# Patient Record
Sex: Male | Born: 1985 | Race: Black or African American | Hispanic: No | Smoking: Never smoker
Health system: Southern US, Community
[De-identification: ages and names within clinical notes are randomized; demographics above are authoritative.]

## PROBLEM LIST (undated history)

## (undated) DIAGNOSIS — N200 Calculus of kidney: Secondary | ICD-10-CM

## (undated) DIAGNOSIS — D168 Benign neoplasm of pelvic bones, sacrum and coccyx: Secondary | ICD-10-CM

## (undated) DIAGNOSIS — K311 Adult hypertrophic pyloric stenosis: Secondary | ICD-10-CM

## (undated) DIAGNOSIS — B029 Zoster without complications: Secondary | ICD-10-CM

## (undated) DIAGNOSIS — F419 Anxiety disorder, unspecified: Secondary | ICD-10-CM

## (undated) HISTORY — DX: Benign neoplasm of pelvic bones, sacrum and coccyx: D16.8

## (undated) HISTORY — PX: ABDOMINAL SURGERY: SHX537

## (undated) HISTORY — DX: Anxiety disorder, unspecified: F41.9

---
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
## 2005-04-19 ENCOUNTER — Emergency Department (HOSPITAL_COMMUNITY): Admission: EM | Admit: 2005-04-19 | Discharge: 2005-04-19 | Payer: Self-pay | Admitting: Family Medicine

## 2005-10-03 ENCOUNTER — Emergency Department (HOSPITAL_COMMUNITY): Admission: EM | Admit: 2005-10-03 | Discharge: 2005-10-03 | Payer: Self-pay | Admitting: Emergency Medicine

## 2005-11-01 ENCOUNTER — Emergency Department (HOSPITAL_COMMUNITY): Admission: EM | Admit: 2005-11-01 | Discharge: 2005-11-02 | Payer: Self-pay | Admitting: Emergency Medicine

## 2006-01-14 ENCOUNTER — Emergency Department (HOSPITAL_COMMUNITY): Admission: EM | Admit: 2006-01-14 | Discharge: 2006-01-14 | Payer: Self-pay | Admitting: Emergency Medicine

## 2006-06-28 ENCOUNTER — Emergency Department (HOSPITAL_COMMUNITY): Admission: EM | Admit: 2006-06-28 | Discharge: 2006-06-28 | Payer: Self-pay | Admitting: Emergency Medicine

## 2006-09-05 ENCOUNTER — Emergency Department (HOSPITAL_COMMUNITY): Admission: EM | Admit: 2006-09-05 | Discharge: 2006-09-05 | Payer: Self-pay | Admitting: Emergency Medicine

## 2006-09-30 ENCOUNTER — Emergency Department (HOSPITAL_COMMUNITY): Admission: EM | Admit: 2006-09-30 | Discharge: 2006-09-30 | Payer: Self-pay | Admitting: Emergency Medicine

## 2006-10-18 ENCOUNTER — Emergency Department (HOSPITAL_COMMUNITY): Admission: EM | Admit: 2006-10-18 | Discharge: 2006-10-18 | Payer: Self-pay | Admitting: Emergency Medicine

## 2006-11-24 ENCOUNTER — Emergency Department (HOSPITAL_COMMUNITY): Admission: EM | Admit: 2006-11-24 | Discharge: 2006-11-24 | Payer: Self-pay | Admitting: Family Medicine

## 2006-12-02 ENCOUNTER — Emergency Department (HOSPITAL_COMMUNITY): Admission: EM | Admit: 2006-12-02 | Discharge: 2006-12-02 | Payer: Self-pay | Admitting: Emergency Medicine

## 2007-02-19 ENCOUNTER — Emergency Department (HOSPITAL_COMMUNITY): Admission: EM | Admit: 2007-02-19 | Discharge: 2007-02-20 | Payer: Self-pay | Admitting: Emergency Medicine

## 2007-03-24 ENCOUNTER — Emergency Department (HOSPITAL_COMMUNITY): Admission: EM | Admit: 2007-03-24 | Discharge: 2007-03-24 | Payer: Self-pay | Admitting: Emergency Medicine

## 2007-04-22 ENCOUNTER — Emergency Department (HOSPITAL_COMMUNITY): Admission: EM | Admit: 2007-04-22 | Discharge: 2007-04-22 | Payer: Self-pay | Admitting: Emergency Medicine

## 2007-05-03 ENCOUNTER — Emergency Department (HOSPITAL_COMMUNITY): Admission: EM | Admit: 2007-05-03 | Discharge: 2007-05-03 | Payer: Self-pay | Admitting: Emergency Medicine

## 2007-07-13 ENCOUNTER — Emergency Department (HOSPITAL_COMMUNITY): Admission: EM | Admit: 2007-07-13 | Discharge: 2007-07-13 | Payer: Self-pay | Admitting: Family Medicine

## 2007-12-11 ENCOUNTER — Emergency Department (HOSPITAL_COMMUNITY): Admission: EM | Admit: 2007-12-11 | Discharge: 2007-12-11 | Payer: Self-pay | Admitting: Family Medicine

## 2008-01-04 ENCOUNTER — Emergency Department (HOSPITAL_COMMUNITY): Admission: EM | Admit: 2008-01-04 | Discharge: 2008-01-04 | Payer: Self-pay | Admitting: Emergency Medicine

## 2008-04-12 ENCOUNTER — Emergency Department (HOSPITAL_COMMUNITY): Admission: EM | Admit: 2008-04-12 | Discharge: 2008-04-12 | Payer: Self-pay | Admitting: Emergency Medicine

## 2008-04-19 ENCOUNTER — Emergency Department (HOSPITAL_COMMUNITY): Admission: EM | Admit: 2008-04-19 | Discharge: 2008-04-19 | Payer: Self-pay | Admitting: Emergency Medicine

## 2008-06-04 ENCOUNTER — Emergency Department (HOSPITAL_COMMUNITY): Admission: EM | Admit: 2008-06-04 | Discharge: 2008-06-04 | Payer: Self-pay | Admitting: Emergency Medicine

## 2008-08-18 ENCOUNTER — Emergency Department (HOSPITAL_COMMUNITY): Admission: EM | Admit: 2008-08-18 | Discharge: 2008-08-18 | Payer: Self-pay | Admitting: Family Medicine

## 2008-09-15 ENCOUNTER — Emergency Department (HOSPITAL_COMMUNITY): Admission: EM | Admit: 2008-09-15 | Discharge: 2008-09-15 | Payer: Self-pay | Admitting: Emergency Medicine

## 2008-12-06 ENCOUNTER — Emergency Department (HOSPITAL_COMMUNITY): Admission: EM | Admit: 2008-12-06 | Discharge: 2008-12-06 | Payer: Self-pay | Admitting: Emergency Medicine

## 2009-05-26 ENCOUNTER — Emergency Department (HOSPITAL_COMMUNITY): Admission: EM | Admit: 2009-05-26 | Discharge: 2009-05-26 | Payer: Self-pay | Admitting: Emergency Medicine

## 2009-06-06 ENCOUNTER — Emergency Department (HOSPITAL_COMMUNITY): Admission: EM | Admit: 2009-06-06 | Discharge: 2009-06-06 | Payer: Self-pay | Admitting: Emergency Medicine

## 2010-09-03 ENCOUNTER — Emergency Department (HOSPITAL_COMMUNITY)
Admission: EM | Admit: 2010-09-03 | Discharge: 2010-09-03 | Disposition: A | Discharge status: Home / Self Care | Payer: Self-pay | Attending: Emergency Medicine | Admitting: Emergency Medicine

## 2010-09-03 DIAGNOSIS — Z711 Person with feared health complaint in whom no diagnosis is made: Secondary | ICD-10-CM | POA: Insufficient documentation

## 2010-09-03 LAB — URINALYSIS, ROUTINE W REFLEX MICROSCOPIC
Bilirubin Urine: NEGATIVE
Glucose, UA: NEGATIVE mg/dL
Hgb urine dipstick: NEGATIVE
Ketones, ur: NEGATIVE mg/dL
Nitrite: NEGATIVE
Protein, ur: NEGATIVE mg/dL
Specific Gravity, Urine: 1.017 (ref 1.005–1.030)
Urobilinogen, UA: 0.2 mg/dL (ref 0.0–1.0)
pH: 6.5 (ref 5.0–8.0)

## 2011-02-05 ENCOUNTER — Inpatient Hospital Stay (INDEPENDENT_AMBULATORY_CARE_PROVIDER_SITE_OTHER)
Admission: RE | Admit: 2011-02-05 | Discharge: 2011-02-05 | Disposition: A | Discharge status: Home / Self Care | Payer: Self-pay | Source: Ambulatory Visit | Attending: Emergency Medicine | Admitting: Emergency Medicine

## 2011-02-05 DIAGNOSIS — N342 Other urethritis: Secondary | ICD-10-CM

## 2011-02-05 LAB — POCT URINALYSIS DIP (DEVICE)
Bilirubin Urine: NEGATIVE
Glucose, UA: NEGATIVE mg/dL
Hgb urine dipstick: NEGATIVE
Ketones, ur: NEGATIVE mg/dL
Nitrite: NEGATIVE
Protein, ur: NEGATIVE mg/dL
Specific Gravity, Urine: 1.02 (ref 1.005–1.030)
Urobilinogen, UA: 0.2 mg/dL (ref 0.0–1.0)
pH: 7 (ref 5.0–8.0)

## 2011-02-06 LAB — GC/CHLAMYDIA PROBE AMP, GENITAL
Chlamydia, DNA Probe: NEGATIVE
GC Probe Amp, Genital: NEGATIVE

## 2011-02-07 LAB — URINE CULTURE
Colony Count: 25000
Culture  Setup Time: 201208141952

## 2011-03-21 LAB — RPR: RPR Ser Ql: NONREACTIVE

## 2011-03-21 LAB — HSV 2 ANTIBODY, IGG: HSV 2 Glycoprotein G Ab, IgG: 0.1

## 2011-03-21 LAB — HSV 1 ANTIBODY, IGG: HSV 1 Glycoprotein G Ab, IgG: 0.1

## 2011-03-25 LAB — POCT URINALYSIS DIP (DEVICE)
Bilirubin Urine: NEGATIVE
Glucose, UA: NEGATIVE
Hgb urine dipstick: NEGATIVE
Ketones, ur: NEGATIVE
Nitrite: NEGATIVE
Operator id: 235561
Protein, ur: NEGATIVE
Specific Gravity, Urine: 1.02
Urobilinogen, UA: 0.2
pH: 8.5 — ABNORMAL HIGH

## 2011-03-25 LAB — POCT I-STAT, CHEM 8
BUN: 9
Calcium, Ion: 1.05 — ABNORMAL LOW
Chloride: 104
Creatinine, Ser: 1
Glucose, Bld: 89
HCT: 49
Hemoglobin: 16.7
Potassium: 3.8
Sodium: 138
TCO2: 24

## 2011-03-25 LAB — DIFFERENTIAL
Basophils Absolute: 0
Basophils Relative: 1
Eosinophils Absolute: 0.1
Eosinophils Relative: 3
Lymphocytes Relative: 32
Lymphs Abs: 1.8
Monocytes Absolute: 0.5
Monocytes Relative: 8
Neutro Abs: 3.2
Neutrophils Relative %: 57

## 2011-03-25 LAB — URINALYSIS, ROUTINE W REFLEX MICROSCOPIC
Bilirubin Urine: NEGATIVE
Glucose, UA: NEGATIVE
Hgb urine dipstick: NEGATIVE
Ketones, ur: NEGATIVE
Nitrite: NEGATIVE
Protein, ur: NEGATIVE
Specific Gravity, Urine: 1.023
Urobilinogen, UA: 0.2
pH: 6.5

## 2011-03-25 LAB — CBC
HCT: 45.7
Hemoglobin: 15.2
MCHC: 33.3
MCV: 90.3
Platelets: 262
RBC: 5.06
RDW: 14.1
WBC: 5.5

## 2011-04-02 LAB — GC/CHLAMYDIA PROBE AMP, GENITAL
Chlamydia, DNA Probe: NEGATIVE
GC Probe Amp, Genital: NEGATIVE

## 2011-04-03 LAB — URINALYSIS, ROUTINE W REFLEX MICROSCOPIC
Bilirubin Urine: NEGATIVE
Glucose, UA: NEGATIVE
Hgb urine dipstick: NEGATIVE
Ketones, ur: NEGATIVE
Nitrite: NEGATIVE
Protein, ur: NEGATIVE
Specific Gravity, Urine: 1.02
Urobilinogen, UA: 0.2
pH: 6.5

## 2011-04-04 LAB — POCT RAPID STREP A: Streptococcus, Group A Screen (Direct): POSITIVE — AB

## 2011-04-05 LAB — I-STAT 8, (EC8 V) (CONVERTED LAB)
Acid-base deficit: 1
BUN: 4 — ABNORMAL LOW
Bicarbonate: 24.7 — ABNORMAL HIGH
Chloride: 104
Glucose, Bld: 89
HCT: 48
Hemoglobin: 16.3
Operator id: 270651
Potassium: 3.4 — ABNORMAL LOW
Sodium: 138
TCO2: 26
pCO2, Ven: 42.2 — ABNORMAL LOW
pH, Ven: 7.376 — ABNORMAL HIGH

## 2011-04-05 LAB — POCT I-STAT CREATININE
Creatinine, Ser: 1
Operator id: 270651

## 2011-04-05 LAB — POCT CARDIAC MARKERS
CKMB, poc: <1 — ABNORMAL LOW
Myoglobin, poc: 43.1
Operator id: 270651
Troponin i, poc: <0.05

## 2011-04-11 LAB — GC/CHLAMYDIA PROBE AMP, GENITAL
Chlamydia, DNA Probe: POSITIVE — AB
GC Probe Amp, Genital: NEGATIVE

## 2011-04-11 LAB — RPR: RPR Ser Ql: NONREACTIVE

## 2011-04-11 LAB — HIV ANTIBODY (ROUTINE TESTING W REFLEX): HIV: NONREACTIVE

## 2011-04-11 LAB — HEPATITIS B SURFACE ANTIGEN: Hepatitis B Surface Ag: NEGATIVE

## 2011-05-06 ENCOUNTER — Emergency Department (HOSPITAL_COMMUNITY)
Admission: EM | Admit: 2011-05-06 | Discharge: 2011-05-06 | Disposition: A | Discharge status: Home / Self Care | Payer: Self-pay | Attending: Emergency Medicine | Admitting: Emergency Medicine

## 2011-05-06 ENCOUNTER — Encounter: Payer: Self-pay | Admitting: Emergency Medicine

## 2011-05-06 DIAGNOSIS — B029 Zoster without complications: Secondary | ICD-10-CM | POA: Insufficient documentation

## 2011-05-06 DIAGNOSIS — M549 Dorsalgia, unspecified: Secondary | ICD-10-CM | POA: Insufficient documentation

## 2011-05-06 MED ORDER — HYDROCODONE-ACETAMINOPHEN 5-325 MG PO TABS: 1.0000 | ORAL_TABLET | Freq: Four times a day (QID) | ORAL | Status: AC | PRN

## 2011-05-06 MED ORDER — VALACYCLOVIR HCL 1 G PO TABS: 1000.0000 mg | ORAL_TABLET | Freq: Three times a day (TID) | ORAL | Status: AC

## 2011-05-06 NOTE — ED Notes (Signed)
Rash on left side of his back x 12 days Painful

## 2011-05-06 NOTE — ED Provider Notes (Signed)
History     CSN: [id number] Arrival date & time: 05/06/2011  6:16 PM   First MD Initiated Contact with Patient 05/06/11 1818      No chief complaint on file.   (Consider location/radiation/quality/duration/timing/severity/associated sxs/prior treatment) HPI Patient states that several days ago.  He developed pain in his left upper back, laterally, and then 2 days later noticed a rash that developed with little blistered areas. The pain radiates around to the midline.  Patient states that he has had no nausea, vomiting, fever, pain, shortness of breath, weakness. No past medical history on file.  No past surgical history on file.  No family history on file.  History  Substance Use Topics  . Smoking status: Not on file  . Smokeless tobacco: Not on file  . Alcohol Use: Not on file      Review of Systems  Constitutional: Negative for fever.  All other systems reviewed and are negative.    Allergies  Review of patient's allergies indicates not on file.  Home Medications  No current outpatient prescriptions on file.  There were no vitals taken for this visit.  Physical Exam  Constitutional: He appears well-developed and well-nourished.  HENT:  Head: Normocephalic and atraumatic.  Cardiovascular: Normal rate and regular rhythm.   Pulmonary/Chest: Effort normal and breath sounds normal.  Skin:       Patient has a patch of redness with erupted vesicles in the area of about T8. this rash is a classic appearance for herpes zoster.    ED Course  Procedures (including critical care time)          MDM  Patient has a herpes zoster rash based on history of present illness and physical exam patient will be advised to return here as needed.  He is given a patient education on the general course of this issue.   Carlyle Dolly, [place] 05/06/11 1851

## 2011-05-07 NOTE — ED Provider Notes (Signed)
Medical screening examination/treatment/procedure(s) were performed by non-physician practitioner and as supervising physician I was immediately available for consultation/collaboration.    Theophil Thivierge R Kandace Elrod, MD 05/07/11 0000 

## 2011-06-07 ENCOUNTER — Emergency Department (INDEPENDENT_AMBULATORY_CARE_PROVIDER_SITE_OTHER)
Admission: EM | Admit: 2011-06-07 | Discharge: 2011-06-07 | Disposition: A | Discharge status: Home / Self Care | Payer: Self-pay | Source: Home / Self Care | Attending: Family Medicine | Admitting: Family Medicine

## 2011-06-07 ENCOUNTER — Encounter (HOSPITAL_COMMUNITY): Payer: Self-pay

## 2011-06-07 DIAGNOSIS — J020 Streptococcal pharyngitis: Secondary | ICD-10-CM

## 2011-06-07 HISTORY — DX: Zoster without complications: B02.9

## 2011-06-07 LAB — POCT RAPID STREP A: Streptococcus, Group A Screen (Direct): POSITIVE — AB

## 2011-06-07 MED ORDER — AMOXICILLIN 500 MG PO CAPS: 500.0000 mg | ORAL_CAPSULE | Freq: Three times a day (TID) | ORAL | Status: AC

## 2011-06-07 NOTE — ED Notes (Signed)
C/o sore throat for 3-4 days, states also had chills this am.

## 2011-06-07 NOTE — ED Provider Notes (Addendum)
History     CSN: [id number] Arrival date & time: 06/07/2011  2:29 PM   First MD Initiated Contact with Patient 06/07/11 1407      Chief Complaint  Patient presents with  . Sore Throat    (Consider location/radiation/quality/duration/timing/severity/associated sxs/prior treatment) Patient is a 25 y.o. male presenting with pharyngitis. The history is provided by the patient.  Sore Throat This is a new problem. The current episode started more than 2 days ago. The problem occurs constantly. The problem has been gradually worsening. The symptoms are aggravated by swallowing.    Past Medical History  Diagnosis Date  . Shingles     Past Surgical History  Procedure Date  . Abdominal surgery     Family History  Problem Relation Age of Onset  . Diabetes Mother   . Hypertension Father     History  Substance Use Topics  . Smoking status: Never Smoker   . Smokeless tobacco: Never Used  . Alcohol Use: No      Review of Systems  Constitutional: Positive for fever and chills.  HENT: Positive for sore throat.   Respiratory: Negative.   Gastrointestinal: Negative.   Skin: Positive for rash.    Allergies  Review of patient's allergies indicates no known allergies.  Home Medications   Current Outpatient Rx  Name Route Sig Dispense Refill  . VALACYCLOVIR HCL 1 G PO TABS Oral Take 1,000 mg by mouth 2 (two) times daily.      . AMOXICILLIN 500 MG PO CAPS Oral Take 1 capsule (500 mg total) by mouth 3 (three) times daily. 30 capsule 0    BP 138/96  Pulse 98  Temp(Src) 98.7 F (37.1 C) (Oral)  Resp 16  SpO2 100%  Physical Exam  Constitutional: He appears well-developed and well-nourished.  HENT:  Head: Normocephalic.  Right Ear: External ear normal.  Left Ear: External ear normal.  Mouth/Throat: Oropharyngeal exudate present.  Eyes: Pupils are equal, round, and reactive to light.  Neck: Normal range of motion. Neck supple.  Lymphadenopathy:    He has cervical  adenopathy.  Skin: Skin is warm and dry. No rash noted.    ED Course  Procedures (including critical care time)  Labs Reviewed  POCT RAPID STREP A ([facility] ONLY) - Abnormal; Notable for the following:    Streptococcus, Group A Screen (Direct) POSITIVE (*)    All other components within normal limits  LAB REPORT - SCANNED   No results found.   1. Strep sore throat       MDM  Strep pos.        Barkley Bruns, MD 06/07/11 1503  Barkley Bruns, MD 06/08/11 1725  Barkley Bruns, MD 06/09/11 1428  Barkley Bruns, MD 06/09/11 [phone]

## 2013-01-28 ENCOUNTER — Encounter (HOSPITAL_COMMUNITY): Payer: Self-pay | Admitting: Adult Health

## 2013-01-28 DIAGNOSIS — Z8619 Personal history of other infectious and parasitic diseases: Secondary | ICD-10-CM | POA: Insufficient documentation

## 2013-01-28 DIAGNOSIS — R002 Palpitations: Secondary | ICD-10-CM | POA: Insufficient documentation

## 2013-01-28 DIAGNOSIS — R42 Dizziness and giddiness: Secondary | ICD-10-CM | POA: Insufficient documentation

## 2013-01-28 DIAGNOSIS — R51 Headache: Secondary | ICD-10-CM | POA: Insufficient documentation

## 2013-01-28 NOTE — ED Notes (Signed)
Presents with anxiety brought on by a pressure felt in occiput. States, "I felt a pressure in the back of my head and it got me kind of scared and worked up. I started breathing fast and my hand and finger went numb and I got dizzy. I feel much better now. Still just a little bit dizzy" pt alert and oriented. No neuro deficits

## 2013-01-29 ENCOUNTER — Emergency Department (HOSPITAL_COMMUNITY)
Admission: EM | Admit: 2013-01-29 | Discharge: 2013-01-29 | Disposition: A | Discharge status: Home / Self Care | Payer: Self-pay | Attending: Emergency Medicine | Admitting: Emergency Medicine

## 2013-01-29 DIAGNOSIS — R002 Palpitations: Secondary | ICD-10-CM

## 2013-01-29 DIAGNOSIS — R519 Headache, unspecified: Secondary | ICD-10-CM

## 2013-01-29 LAB — POCT I-STAT, CHEM 8
BUN: 13 mg/dL (ref 6–23)
Calcium, Ion: 1.18 mmol/L (ref 1.12–1.23)
Chloride: 103 mEq/L (ref 96–112)
Creatinine, Ser: 1 mg/dL (ref 0.50–1.35)
Glucose, Bld: 87 mg/dL (ref 70–99)
HCT: 46 % (ref 39.0–52.0)
Hemoglobin: 15.6 g/dL (ref 13.0–17.0)
Potassium: 3.9 mEq/L (ref 3.5–5.1)
Sodium: 139 mEq/L (ref 135–145)
TCO2: 26 mmol/L (ref 0–100)

## 2013-01-29 MED ORDER — IBUPROFEN 800 MG PO TABS: 800.0000 mg | ORAL_TABLET | Freq: Three times a day (TID) | ORAL | Status: DC

## 2013-01-29 NOTE — ED Provider Notes (Signed)
CSN: [id number]     Arrival date & time 01/28/13  2338 History     First MD Initiated Contact with Patient 01/29/13 0109     Chief Complaint  Patient presents with  . Anxiety   (Consider location/radiation/quality/duration/timing/severity/associated sxs/prior Treatment) HPI History provided by patient. Over the last 2 days has been having left-sided headache that he describes as throbbing, intermittent lasts for a brief amount of time and goes away on its own. Seems to be worse when he lays down flat in bed. No associated trauma, neck pain, neck stiffness, fever or, rash, nausea or vomiting. Tonight at home he laid down and developed this headache became anxious with palpitations and dizziness, tingling in hands and fingers. His mother called 911. Symptoms last about 15 minutes and resolved prior to arrival. Now the emergency department states he is feeling much better. No history of panic or anxiety attack. Headache resolved. No weakness or numbness. No difficulty with vision or gait. Patient works out regularly denies any dehydration or prolonged heat exposure.  Past Medical History  Diagnosis Date  . Shingles    Past Surgical History  Procedure Laterality Date  . Abdominal surgery     Family History  Problem Relation Age of Onset  . Diabetes Mother   . Hypertension Father    History  Substance Use Topics  . Smoking status: Never Smoker   . Smokeless tobacco: Never Used  . Alcohol Use: No    Review of Systems  Constitutional: Negative for fever and chills.  HENT: Negative for neck pain and neck stiffness.   Eyes: Negative for pain.  Respiratory: Negative for shortness of breath.   Cardiovascular: Positive for palpitations. Negative for chest pain.  Gastrointestinal: Negative for abdominal pain.  Genitourinary: Negative for dysuria.  Musculoskeletal: Negative for back pain.  Skin: Negative for rash.  Neurological: Positive for dizziness and headaches.  All other systems  reviewed and are negative.    Allergies  Review of patient's allergies indicates no known allergies.  Home Medications  No current outpatient prescriptions on file. BP 132/76  Pulse 68  Temp(Src) 98.2 F (36.8 C) (Oral)  Resp 21  SpO2 100% Physical Exam  Constitutional: He is oriented to person, place, and time. He appears well-developed and well-nourished.  HENT:  Head: Normocephalic and atraumatic.  Eyes: EOM are normal. Pupils are equal, round, and reactive to light.  Neck: Neck supple.  Cardiovascular: Normal rate, regular rhythm and intact distal pulses.   Pulmonary/Chest: Effort normal and breath sounds normal. No respiratory distress.  Musculoskeletal: Normal range of motion. He exhibits no edema.  Neurological: He is alert and oriented to person, place, and time. He displays normal reflexes. No cranial nerve deficit. Coordination normal.  Speech clear, cooperative, appropriate, no focal deficits, gait intact  Skin: Skin is warm and dry.    ED Course   Procedures (including critical care time)   Results for orders placed during the hospital encounter of 01/29/13  POCT I-STAT, CHEM 8      Result Value Range   Sodium 139  135 - 145 mEq/L   Potassium 3.9  3.5 - 5.1 mEq/L   Chloride 103  96 - 112 mEq/L   BUN 13  6 - 23 mg/dL   Creatinine, Ser 4.09  0.50 - 1.35 mg/dL   Glucose, Bld 87  70 - 99 mg/dL   Calcium, Ion 8.11  9.14 - 1.23 mmol/L   TCO2 26  0 - 100 mmol/L   Hemoglobin  15.6  13.0 - 17.0 g/dL   HCT 40.9  81.1 - 91.4 %   Oral fluids provided  3:19 AM on recheck remains asymptomatic in the emergency department. Cardiac monitoring in the ER within normal limits.   1. Palpitations 2. headache intermittent  Plan discharge home with outpatient followup. Dehydration, palpitations and headache precautions provided. Reliable historian agrees to all discharge and followup instructions.  MDM  Palpitations, dizziness, tingling consistent with anxiety attack  related to intermittent headaches  No indication for emergent imaging of brain at this time  Normal neuro exam and asymptomatic in the emergency department  Metabolic panel reviewed as above, normal electrolytes  Vital signs and nursing notes reviewed and considered  Sunnie Nielsen, MD 01/29/13 [phone]

## 2013-02-18 ENCOUNTER — Emergency Department (HOSPITAL_COMMUNITY)
Admission: EM | Admit: 2013-02-18 | Discharge: 2013-02-18 | Disposition: A | Discharge status: Home / Self Care | Payer: Self-pay | Source: Home / Self Care | Attending: Family Medicine | Admitting: Family Medicine

## 2013-02-18 ENCOUNTER — Encounter (HOSPITAL_COMMUNITY): Payer: Self-pay | Admitting: *Deleted

## 2013-02-18 DIAGNOSIS — J300 Vasomotor rhinitis: Secondary | ICD-10-CM

## 2013-02-18 DIAGNOSIS — J309 Allergic rhinitis, unspecified: Secondary | ICD-10-CM

## 2013-02-18 HISTORY — DX: Adult hypertrophic pyloric stenosis: K31.1

## 2013-02-18 MED ORDER — FLUTICASONE PROPIONATE 50 MCG/ACT NA SUSP: 1.0000 | Freq: Two times a day (BID) | NASAL | Status: DC

## 2013-02-18 MED ORDER — CETIRIZINE HCL 10 MG PO TABS: 10.0000 mg | ORAL_TABLET | Freq: Every day | ORAL | Status: DC

## 2013-02-18 NOTE — ED Notes (Signed)
C/O "pressure" from bridge of nose extending into bilat medial cheeks x 5 days.  Denies injury.  States it doesn't feel like it's his sinuses.  Denies any nasal congestion or runny nose.

## 2013-02-18 NOTE — ED Provider Notes (Signed)
CSN: [id number]     Arrival date & time 02/18/13  1932 History   First MD Initiated Contact with Patient 02/18/13 2007     Chief Complaint  Patient presents with  . Facial Pain   (Consider location/radiation/quality/duration/timing/severity/associated sxs/prior Treatment) Patient is a 27 y.o. male presenting with URI. The history is provided by the patient.  URI Presenting symptoms: congestion and rhinorrhea   Presenting symptoms: no ear pain, no facial pain, no fever and no sore throat   Severity:  Mild Duration:  5 days Progression:  Unchanged Chronicity:  New Relieved by:  None tried Worsened by:  Nothing tried Ineffective treatments:  None tried Associated symptoms: no sinus pain and no sneezing     Past Medical History  Diagnosis Date  . Shingles   . Pyloric stenosis    Past Surgical History  Procedure Laterality Date  . Abdominal surgery      pyloric stenosis   Family History  Problem Relation Age of Onset  . Diabetes Mother   . Hypertension Father    History  Substance Use Topics  . Smoking status: Never Smoker   . Smokeless tobacco: Never Used  . Alcohol Use: No    Review of Systems  Constitutional: Negative.  Negative for fever.  HENT: Positive for congestion and rhinorrhea. Negative for ear pain, sore throat, sneezing and postnasal drip.     Allergies  Review of patient's allergies indicates no known allergies.  Home Medications   Current Outpatient Rx  Name  Route  Sig  Dispense  Refill  . cetirizine (ZYRTEC) 10 MG tablet   Oral   Take 1 tablet (10 mg total) by mouth daily.   30 tablet   1   . fluticasone (FLONASE) 50 MCG/ACT nasal spray   Nasal   Place 1 spray into the nose 2 (two) times daily.   1 g   2   . ibuprofen (ADVIL,MOTRIN) 800 MG tablet   Oral   Take 1 tablet (800 mg total) by mouth 3 (three) times daily.   21 tablet   0    BP 145/97  Pulse 85  Temp(Src) 99.1 F (37.3 C) (Oral)  SpO2 100% Physical Exam  Nursing  note and vitals reviewed. Constitutional: He is oriented to person, place, and time. He appears well-developed and well-nourished.  HENT:  Head: Normocephalic.  Right Ear: External ear normal.  Left Ear: External ear normal.  Nose: Mucosal edema and rhinorrhea present.  Mouth/Throat: Oropharynx is clear and moist.  Eyes: Pupils are equal, round, and reactive to light.  Neck: Normal range of motion. Neck supple.  Pulmonary/Chest: Effort normal and breath sounds normal.  Lymphadenopathy:    He has no cervical adenopathy.  Neurological: He is alert and oriented to person, place, and time.  Skin: Skin is warm and dry.  Psychiatric: He has a normal mood and affect.    ED Course  Procedures (including critical care time) Labs Review Labs Reviewed - No data to display Imaging Review No results found.  MDM   1. VMR (vasomotor rhinitis)       Linna Hoff, MD 02/18/13 2041

## 2013-05-26 ENCOUNTER — Emergency Department (HOSPITAL_COMMUNITY)
Admission: EM | Admit: 2013-05-26 | Discharge: 2013-05-26 | Discharge status: Against Medical Advice | Payer: Self-pay | Attending: Emergency Medicine | Admitting: Emergency Medicine

## 2013-05-26 ENCOUNTER — Encounter (HOSPITAL_COMMUNITY): Payer: Self-pay | Admitting: Emergency Medicine

## 2013-05-26 DIAGNOSIS — R109 Unspecified abdominal pain: Secondary | ICD-10-CM | POA: Insufficient documentation

## 2013-05-26 NOTE — ED Notes (Signed)
No answer in waiting area.

## 2013-05-26 NOTE — ED Notes (Signed)
Unable to locate patient x3.

## 2013-05-26 NOTE — ED Notes (Signed)
Pt in c/o right flank pain that started on Sunday, states the pain started more in the lowe back area and has radiated, denies n/v or other symptoms

## 2013-05-28 ENCOUNTER — Encounter (HOSPITAL_COMMUNITY): Payer: Self-pay | Admitting: Emergency Medicine

## 2013-05-28 ENCOUNTER — Emergency Department (HOSPITAL_COMMUNITY)
Admission: EM | Admit: 2013-05-28 | Discharge: 2013-05-28 | Disposition: A | Discharge status: Home / Self Care | Payer: Self-pay | Attending: Emergency Medicine | Admitting: Emergency Medicine

## 2013-05-28 ENCOUNTER — Emergency Department (HOSPITAL_COMMUNITY): Payer: Self-pay

## 2013-05-28 DIAGNOSIS — M549 Dorsalgia, unspecified: Secondary | ICD-10-CM | POA: Insufficient documentation

## 2013-05-28 DIAGNOSIS — R0789 Other chest pain: Secondary | ICD-10-CM | POA: Insufficient documentation

## 2013-05-28 DIAGNOSIS — Z8719 Personal history of other diseases of the digestive system: Secondary | ICD-10-CM | POA: Insufficient documentation

## 2013-05-28 DIAGNOSIS — M7918 Myalgia, other site: Secondary | ICD-10-CM

## 2013-05-28 DIAGNOSIS — Z8619 Personal history of other infectious and parasitic diseases: Secondary | ICD-10-CM | POA: Insufficient documentation

## 2013-05-28 DIAGNOSIS — Z79899 Other long term (current) drug therapy: Secondary | ICD-10-CM | POA: Insufficient documentation

## 2013-05-28 MED ORDER — ORPHENADRINE CITRATE ER 100 MG PO TB12: 100.0000 mg | ORAL_TABLET | Freq: Two times a day (BID) | ORAL | Status: DC | PRN

## 2013-05-28 MED ORDER — NAPROXEN 500 MG PO TABS: 500.0000 mg | ORAL_TABLET | Freq: Two times a day (BID) | ORAL | Status: DC | PRN

## 2013-05-28 NOTE — ED Notes (Signed)
Per pt sts 1 week of intermittent right chest and upper back pain. Denies injury or heavy lifting. Denies cough, fever. sts hurts when he turns his neck.

## 2013-05-28 NOTE — ED Provider Notes (Signed)
CSN: [id number]     Arrival date & time 05/28/13  1519 History   First MD Initiated Contact with Patient 05/28/13 1823     Chief Complaint  Patient presents with  . Chest Pain   (Consider location/radiation/quality/duration/timing/severity/associated sxs/prior Treatment) HPI  Chief complaint chest pain or back pain history provided by patient  Patient is a 27 year old male no significant past medical history comes in today with chief complaint of chest pain in lower back pain. Patient states this is been present for approximately 5 days. He is describing this as an achy pain. Is localized just above his gluteal muscle on the right side as well as the right pectoral muscle. Patient states has been intermittent. It is worse with working and movement. There is mild in severity. No treatments tried.   Past Medical History  Diagnosis Date  . Shingles   . Pyloric stenosis    Past Surgical History  Procedure Laterality Date  . Abdominal surgery      pyloric stenosis   Family History  Problem Relation Age of Onset  . Diabetes Mother   . Hypertension Father    History  Substance Use Topics  . Smoking status: Never Smoker   . Smokeless tobacco: Never Used  . Alcohol Use: No    Review of Systems  Constitutional: Negative for fatigue.  Respiratory: Negative for shortness of breath.   Cardiovascular: Negative for chest pain.  Gastrointestinal: Negative for abdominal pain.  Genitourinary: Negative for dysuria.  Musculoskeletal: Positive for myalgias (right pectoral back pain). Negative for back pain.  Skin: Negative for rash.  Neurological: Negative for headaches.  Psychiatric/Behavioral: Negative for agitation.  All other systems reviewed and are negative.    Allergies  Peanut-containing drug products  Home Medications   Current Outpatient Rx  Name  Route  Sig  Dispense  Refill  . Multiple Vitamin (MULTIVITAMIN WITH MINERALS) TABS tablet   Oral   Take 1 tablet by mouth  daily.         . naproxen (NAPROSYN) 500 MG tablet   Oral   Take 1 tablet (500 mg total) by mouth 2 (two) times daily as needed.   30 tablet   0   . orphenadrine (NORFLEX) 100 MG tablet   Oral   Take 1 tablet (100 mg total) by mouth 2 (two) times daily as needed for muscle spasms.   10 tablet   0    BP 141/87  Pulse 77  Temp(Src) 97.3 F (36.3 C) (Oral)  Resp 18  Ht 5\' 8"  (1.727 m)  Wt 151 lb (68.493 kg)  BMI 22.96 kg/m2  SpO2 100% Physical Exam  Nursing note and vitals reviewed. Constitutional: He is oriented to person, place, and time. He appears well-developed and well-nourished.  HENT:  Head: Normocephalic and atraumatic.  Eyes: EOM are normal. Pupils are equal, round, and reactive to light.  Neck: Normal range of motion.  Cardiovascular: Normal rate, regular rhythm and intact distal pulses.   Pulmonary/Chest: Effort normal and breath sounds normal. No respiratory distress. He exhibits no tenderness.   No chest wall tenderness palpation, deformities or other visible traumatic injuries  Abdominal: Soft. He exhibits no distension. There is no tenderness. There is no rebound and no guarding.  Musculoskeletal: Normal range of motion.  Patient with subjective achy pains and right pectoral muscle. Not reproducible with palpation. Reproducible with torquing. Full range of motion. Patient also mild achy pain right lower back. Not spinal tenderness. Reproducible pain with palpation of  the sciatic area.  Neurological: He is alert and oriented to person, place, and time. No cranial nerve deficit. He exhibits normal muscle tone. Coordination normal.  Neurovascularly intact throughout. Patient denies any saddle anesthesia or testicular pain. Has deferred exam at this time  Skin: Skin is warm and dry. No rash noted.  Psychiatric: He has a normal mood and affect. His behavior is normal. Judgment and thought content normal.    ED Course  Procedures (including critical care  time) Labs Review Labs Reviewed - No data to display Imaging Review Dg Chest 2 View  05/28/2013   CLINICAL DATA:  Chest pain.  EXAM: CHEST  2 VIEW  COMPARISON:  01/04/2008.  FINDINGS: The heart size and mediastinal contours are within normal limits. Both lungs are clear. The visualized skeletal structures are unremarkable.  IMPRESSION: No active cardiopulmonary disease.   Electronically Signed   By: Maisie Fus  Register   On: 05/28/2013 16:33    EKG Interpretation   None       MDM   1. Musculoskeletal pain     Afebrile vital signs stable. Patient with likely musculoskeletal pain. Patient with right-sided chest pain. No reports of left-sided chest pain, shortness of breath. No CAD risk factors. Otherwise healthy. Clear to auscultation bilaterally. Normal cardiac exam with no murmurs rubs or gallops. Pain most consistent with musculoskeletal chest pain. Doubt CAD. Chest x-ray obtained within normal limits no signs of serious intrathoracic pathology. Patient also with lower back pain. Isolated to the sciatic area reproducible with palpation. No concerning neurological findings. Also likely musculoskeletal pain. No L-spine tenderness palpation. Is visible deformities step-offs or injuries. Recommend ibuprofen/naproxen. We'll provide small prescription of muscle relaxer. Also instructions for rest ice. No further acute issues until discharge    Bridgett Larsson, MD 05/28/13 2322

## 2013-05-29 NOTE — ED Provider Notes (Signed)
Pt with R sided CP radiating to the lower back - worse with rotation at the hips - no respiratory complaints, normal CV and pulm exam, no edema, reproducible with palpation and movement, likely MSk in origin.  No signs of cardiovascular disease.  ED ECG REPORT  I personally interpreted this EKG   Date: 05/29/2013   Rate: 79  Rhythm: normal sinus rhythm  QRS Axis: normal  Intervals: normal  ST/T Wave abnormalities: normal  Conduction Disutrbances:none  Narrative Interpretation:   Old EKG Reviewed: unchanged  I saw and evaluated the patient, reviewed the resident's note and I agree with the findings and plan.     Vida Roller, MD 05/29/13 [phone]

## 2013-06-13 ENCOUNTER — Encounter (HOSPITAL_COMMUNITY): Payer: Self-pay | Admitting: Emergency Medicine

## 2013-06-13 ENCOUNTER — Emergency Department (INDEPENDENT_AMBULATORY_CARE_PROVIDER_SITE_OTHER)
Admission: EM | Admit: 2013-06-13 | Discharge: 2013-06-13 | Disposition: A | Discharge status: Home / Self Care | Payer: Self-pay | Source: Home / Self Care | Attending: Family Medicine | Admitting: Family Medicine

## 2013-06-13 DIAGNOSIS — R197 Diarrhea, unspecified: Secondary | ICD-10-CM

## 2013-06-13 MED ORDER — LOPERAMIDE HCL 2 MG PO CAPS: 2.0000 mg | ORAL_CAPSULE | Freq: Four times a day (QID) | ORAL | Status: DC | PRN

## 2013-06-13 NOTE — ED Provider Notes (Signed)
CSN: [id number]     Arrival date & time 06/13/13  0901 History   First MD Initiated Contact with Patient 06/13/13 [phone]     Chief Complaint  Patient presents with  . URI  . Diarrhea   (Consider location/radiation/quality/duration/timing/severity/associated sxs/prior Treatment) Patient is a 27 y.o. male presenting with diarrhea. The history is provided by the patient.  Diarrhea Quality:  Watery (non-bloody) Severity:  Mild Onset quality:  Gradual Duration:  1 day Timing:  Intermittent Progression:  Improving Associated symptoms: fever   Associated symptoms: no vomiting   Associated symptoms comment:  Abdominal cramping & nausea Risk factors: sick contacts   Risk factors: no recent antibiotic use, no suspicious food intake and no travel to endemic areas     Past Medical History  Diagnosis Date  . Shingles   . Pyloric stenosis    Past Surgical History  Procedure Laterality Date  . Abdominal surgery      pyloric stenosis   Family History  Problem Relation Age of Onset  . Diabetes Mother   . Hypertension Father    History  Substance Use Topics  . Smoking status: Never Smoker   . Smokeless tobacco: Never Used  . Alcohol Use: No    Review of Systems  Constitutional: Positive for fever.  Gastrointestinal: Positive for diarrhea. Negative for vomiting.  All other systems reviewed and are negative.    Allergies  Peanut-containing drug products  Home Medications   Current Outpatient Rx  Name  Route  Sig  Dispense  Refill  . Multiple Vitamin (MULTIVITAMIN WITH MINERALS) TABS tablet   Oral   Take 1 tablet by mouth daily.         . naproxen (NAPROSYN) 500 MG tablet   Oral   Take 1 tablet (500 mg total) by mouth 2 (two) times daily as needed.   30 tablet   0   . orphenadrine (NORFLEX) 100 MG tablet   Oral   Take 1 tablet (100 mg total) by mouth 2 (two) times daily as needed for muscle spasms.   10 tablet   0    BP 130/86  Pulse 94  Temp(Src) 99 F  (37.2 C) (Oral)  Resp 18  SpO2 100% Physical Exam  Nursing note and vitals reviewed. Constitutional: He is oriented to person, place, and time. He appears well-developed and well-nourished. No distress.  HENT:  Head: Normocephalic and atraumatic.  Mouth/Throat: Oropharynx is clear and moist and mucous membranes are normal.  Eyes: Conjunctivae are normal. No scleral icterus.  Neck: Normal range of motion. Neck supple.  Cardiovascular: Normal rate, regular rhythm and normal heart sounds.   Pulmonary/Chest: Effort normal and breath sounds normal. No respiratory distress.  Abdominal: Soft. Bowel sounds are normal. He exhibits no distension. There is no tenderness.  Musculoskeletal: Normal range of motion.  Neurological: He is alert and oriented to person, place, and time.  Skin: Skin is warm and dry. No rash noted.  Psychiatric: He has a normal mood and affect. His behavior is normal.    ED Course  Procedures (including critical care time) Labs Review Labs Reviewed - No data to display Imaging Review No results found.  EKG Interpretation    Date/Time:    Ventricular Rate:    PR Interval:    QRS Duration:   QT Interval:    QTC Calculation:   R Axis:     Text Interpretation:              MDM  Advised patient this would likely be a self limited process with stool returning to normal over next 4-5 days. Cautioned to observe for blood in stools, severe abdominal pain or fever. May use loperamide as directed. Continue with oral rehydration and follow up if no improvement in one week.     Jess Barters [place], [place] 06/13/13 [phone]

## 2013-06-13 NOTE — ED Notes (Signed)
Cold sx which started yesterday morning States he has chronic diarrhea since yesterday morning. States every time he ate his stomach ache.   Denies vomiting but does have nausea Denies cough but does have sinus problems benadryl was taking for fever  States he is only person in the house feeling like this Did eat a brownie at job.

## 2013-06-14 NOTE — ED Provider Notes (Signed)
Medical screening examination/treatment/procedure(s) were performed by a resident physician or non-physician practitioner and as the supervising physician I was immediately available for consultation/collaboration.  Brandis Wixted, MD    Jalayna Josten S Arn Mcomber, MD 06/14/13 0749 

## 2013-07-15 ENCOUNTER — Encounter (HOSPITAL_COMMUNITY): Payer: Self-pay | Admitting: Emergency Medicine

## 2013-07-15 ENCOUNTER — Emergency Department (HOSPITAL_COMMUNITY)
Admission: EM | Admit: 2013-07-15 | Discharge: 2013-07-15 | Disposition: A | Discharge status: Home / Self Care | Payer: BC Managed Care – PPO | Source: Home / Self Care | Attending: Family Medicine | Admitting: Family Medicine

## 2013-07-15 DIAGNOSIS — Z888 Allergy status to other drugs, medicaments and biological substances status: Secondary | ICD-10-CM

## 2013-07-15 DIAGNOSIS — Z789 Other specified health status: Secondary | ICD-10-CM

## 2013-07-15 MED ORDER — PROPRANOLOL HCL 20 MG PO TABS: ORAL_TABLET | ORAL | Status: DC

## 2013-07-15 NOTE — ED Notes (Signed)
Pt c/o cold sxs onset Monday  Sxs include: chills, dizziness, anxiety, fatigue Denies: f/v/n/d, SOB, wheezing Pt is alert w/no signs of acute distress.

## 2013-07-15 NOTE — Discharge Instructions (Signed)
Faiz,   It was nice to meet you. I think that you had a reaction to the medication. Please avoid any decongestants like phenylephrine or sudafed.   Come back if you have any recurrences.   Take Care,   Dr. Maricela Bo

## 2013-07-15 NOTE — ED Provider Notes (Signed)
CSN: [id number]     Arrival date & time 07/15/13  1836 History   First MD Initiated Contact with Patient 07/15/13 1923     Chief Complaint  Patient presents with  . URI   (Consider location/radiation/quality/duration/timing/severity/associated sxs/prior Treatment) HPI  28 year old male previously healthy who presents with concerns of a medication reaction. He has had cold symptoms for several days and yesterday decided to take Alka-Seltzer with a decongestant. He does after that he felt strange including fever and chills and then had racing of his heart as well as shortness of breath and thought was a panic attack. This occurred while he was at home. It lasted approximately 25 minutes. It resolved on its own. He has not have a similar history of anxiety attacks or palpitations. He awoke today without symptoms and went to work and had no trouble day. He denies any recent stressors in his life. Yesterday was the first time that he took this specific medication. He has no history allergies to other medications.  Currently denies shortness of breath, chest pain, nausea, vomiting, fever, chills.  Past Medical History  Diagnosis Date  . Shingles   . Pyloric stenosis    Past Surgical History  Procedure Laterality Date  . Abdominal surgery      pyloric stenosis   Family History  Problem Relation Age of Onset  . Diabetes Mother   . Hypertension Father    History  Substance Use Topics  . Smoking status: Never Smoker   . Smokeless tobacco: Never Used  . Alcohol Use: No    Review of Systems See history of present illness Allergies  Peanut-containing drug products  Home Medications   Current Outpatient Rx  Name  Route  Sig  Dispense  Refill  . loperamide (IMODIUM) 2 MG capsule   Oral   Take 1 capsule (2 mg total) by mouth 4 (four) times daily as needed for diarrhea or loose stools.   12 capsule   0   . Multiple Vitamin (MULTIVITAMIN WITH MINERALS) TABS tablet   Oral   Take 1  tablet by mouth daily.         . naproxen (NAPROSYN) 500 MG tablet   Oral   Take 1 tablet (500 mg total) by mouth 2 (two) times daily as needed.   30 tablet   0   . orphenadrine (NORFLEX) 100 MG tablet   Oral   Take 1 tablet (100 mg total) by mouth 2 (two) times daily as needed for muscle spasms.   10 tablet   0   . propranolol (INDERAL) 20 MG tablet      Take one tablet as needed for palpitations or situational anxiety.   30 tablet   0    BP 138/68  Pulse 82  Temp(Src) 98.9 F (37.2 C) (Oral)  Resp 16  SpO2 100% Physical Exam Gen.: Young male, well-appearing, non-distress, pleasant and conversant Eyes: pupils equal reactive to light, funduscopic exam normal Neck: no thyroid tenderness no thyroid nodules no thyromegaly Heart: regular rate and rhythm, no murmurs Lungs: normal work of breathing, clear to auscultation bilaterally  Skin; no rashes   ED Course  Procedures (including critical care time) Labs Review Labs Reviewed - No data to display Imaging Review No results found.    MDM   1. Medication intolerance    Patient with reaction to decongestant including palpitations and anxiety due sympathetic overstimulation. This is not likely to be a tachydysrhythmia. Given patient's affect and denial recent stressors  or anxiety in his life, it is unlikely that this was a true panic attack. The patient was given a prescription for propranolol to be taken as needed for palpitations or anxiety control but it recurs to come in to be evaluated. He gallops understanding.    Angelica Ran, MD 07/15/13 2040

## 2013-07-16 NOTE — ED Provider Notes (Signed)
Medical screening examination/treatment/procedure(s) were performed by resident physician or non-physician practitioner and as supervising physician I was immediately available for consultation/collaboration.   KINDL,JAMES DOUGLAS MD.   James D Kindl, MD 07/16/13 1308 

## 2013-09-28 DIAGNOSIS — R21 Rash and other nonspecific skin eruption: Secondary | ICD-10-CM | POA: Insufficient documentation

## 2013-09-28 DIAGNOSIS — Z8719 Personal history of other diseases of the digestive system: Secondary | ICD-10-CM | POA: Insufficient documentation

## 2013-09-28 DIAGNOSIS — Z8619 Personal history of other infectious and parasitic diseases: Secondary | ICD-10-CM | POA: Insufficient documentation

## 2013-09-28 DIAGNOSIS — Z79899 Other long term (current) drug therapy: Secondary | ICD-10-CM | POA: Insufficient documentation

## 2013-09-28 NOTE — ED Notes (Signed)
Skin rash on rt. Upper thigh, rt. Lateral side of neck, posterior side of back; and purplish color on tongue.

## 2013-09-29 ENCOUNTER — Emergency Department (HOSPITAL_COMMUNITY)
Admission: EM | Admit: 2013-09-29 | Discharge: 2013-09-29 | Disposition: A | Discharge status: Home / Self Care | Payer: BC Managed Care – PPO | Attending: Emergency Medicine | Admitting: Emergency Medicine

## 2013-09-29 DIAGNOSIS — R21 Rash and other nonspecific skin eruption: Secondary | ICD-10-CM

## 2013-09-29 MED ORDER — CLOTRIMAZOLE 1 % EX CREA: TOPICAL_CREAM | CUTANEOUS | Status: DC

## 2013-09-29 NOTE — ED Provider Notes (Signed)
CSN: [id number]     Arrival date & time 09/28/13  2340 History  This chart was scribed for non-physician practitioner, Cleatrice Burke, PA-C, working with Delora Fuel, MD by Celesta Gentile, ED Scribe. This patient was seen in room TR11C/TR11C and the patient's care was started at 12:18 AM.  Chief Complaint  Patient presents with  . Rash   The history is provided by the patient. No language interpreter was used.   HPI Comments: Shawn Howard is a 28 y.o. male who presents to the Emergency Department complaining of a constant gradually worsening rash located on his back, side, and neck that started about 2 days ago.  Pt describes the rash as intermittent itching.  Pt denies having this rash prior to this incident.  Pt states he took a benadryl with some relief.  Pt also complains of a purple spot on his tongue.  He denies eating anything purple.  Pt denies fevers, chills, abdominal pain, SOB, and dysphagia.  Pt states he is otherwise healthy.     Past Medical History  Diagnosis Date  . Shingles   . Pyloric stenosis    Past Surgical History  Procedure Laterality Date  . Abdominal surgery      pyloric stenosis   Family History  Problem Relation Age of Onset  . Diabetes Mother   . Hypertension Father    History  Substance Use Topics  . Smoking status: Never Smoker   . Smokeless tobacco: Never Used  . Alcohol Use: No    Review of Systems  Constitutional: Negative for fever and chills.  HENT: Negative for congestion and rhinorrhea.   Respiratory: Negative for cough and shortness of breath.   Cardiovascular: Negative for chest pain.  Gastrointestinal: Negative for nausea, vomiting, abdominal pain and diarrhea.  Musculoskeletal: Negative for back pain.  Skin: Positive for rash. Negative for color change.  Neurological: Negative for syncope.  Psychiatric/Behavioral: Negative for behavioral problems and confusion.  All other systems reviewed and are negative.    Allergies   Peanut-containing drug products  Home Medications   Current Outpatient Rx  Name  Route  Sig  Dispense  Refill  . loperamide (IMODIUM) 2 MG capsule   Oral   Take 1 capsule (2 mg total) by mouth 4 (four) times daily as needed for diarrhea or loose stools.   12 capsule   0   . Multiple Vitamin (MULTIVITAMIN WITH MINERALS) TABS tablet   Oral   Take 1 tablet by mouth daily.         . naproxen (NAPROSYN) 500 MG tablet   Oral   Take 1 tablet (500 mg total) by mouth 2 (two) times daily as needed.   30 tablet   0   . orphenadrine (NORFLEX) 100 MG tablet   Oral   Take 1 tablet (100 mg total) by mouth 2 (two) times daily as needed for muscle spasms.   10 tablet   0   . propranolol (INDERAL) 20 MG tablet      Take one tablet as needed for palpitations or situational anxiety.   30 tablet   0    Triage Vitals: BP 161/88  Pulse 83  Temp(Src) 98.6 F (37 C) (Oral)  Resp 16  SpO2 100%  Physical Exam  Nursing note and vitals reviewed. Constitutional: He is oriented to person, place, and time. He appears well-developed and well-nourished. No distress.  HENT:  Head: Normocephalic and atraumatic.  Right Ear: External ear normal.  Left Ear: External  ear normal.  Nose: Nose normal.  Light pink area on tongue. No lesions. Consistent with residue of food or drink.   Eyes: Conjunctivae are normal.  Neck: Normal range of motion. No tracheal deviation present.  Cardiovascular: Normal rate, regular rhythm and normal heart sounds.   Pulmonary/Chest: Effort normal and breath sounds normal. No stridor.  Abdominal: Soft. He exhibits no distension. There is no tenderness.  Musculoskeletal: Normal range of motion.  Neurological: He is alert and oriented to person, place, and time.  Skin: Skin is warm and dry. Rash noted. He is not diaphoretic.  1.5 cm area of mild scaling of skin. No superimposed cellulitis. No erythema, induration, fluctuance.   Psychiatric: He has a normal mood and  affect. His behavior is normal.    ED Course  Procedures (including critical care time) DIAGNOSTIC STUDIES: Oxygen Saturation is 100% on RA, normal by my interpretation.    COORDINATION OF CARE: 12:23 AM-Will prescribe antifungal medication.  Patient informed of current plan of treatment and evaluation and agrees with plan.     MDM   Final diagnoses:  Rash   Patient presents to ED for evaluation of rash.   No evidence of SJS or necrotizing fasciitis. Due to pruritic and not painful nature of blisters do not suspect pemphigus vulgaris.  No blisters, no pustules, no warmth, no draining sinus tracts, no superficial abscesses, no bullous impetigo, no vesicles, no desquamation, no target lesions with dusky purpura or a central bulla. Not tender to touch.   Rash appears fungal. Will treat with Lotrimin.   Patient also would like evaluation of "purple" area on tongue. There is a light pink area that looks as though the patient just ate something of that color. It is not causing any symptoms. Patient encouraged to follow up with PCP about this.   I personally performed the services described in this documentation, which was scribed in my presence. The recorded information has been reviewed and is accurate.      Elwyn Lade, PA-C 09/29/13 1610

## 2013-09-29 NOTE — Discharge Instructions (Signed)

## 2013-09-30 NOTE — ED Provider Notes (Signed)
Medical screening examination/treatment/procedure(s) were performed by non-physician practitioner and as supervising physician I was immediately available for consultation/collaboration.   Delora Fuel, MD 00/71/21 9758

## 2014-09-01 ENCOUNTER — Emergency Department (HOSPITAL_COMMUNITY)
Admission: EM | Admit: 2014-09-01 | Discharge: 2014-09-02 | Disposition: A | Discharge status: Home / Self Care | Payer: Self-pay | Attending: Emergency Medicine | Admitting: Emergency Medicine

## 2014-09-01 ENCOUNTER — Encounter (HOSPITAL_COMMUNITY): Payer: Self-pay

## 2014-09-01 ENCOUNTER — Emergency Department (HOSPITAL_COMMUNITY): Payer: Self-pay

## 2014-09-01 DIAGNOSIS — Y288XXA Contact with other sharp object, undetermined intent, initial encounter: Secondary | ICD-10-CM | POA: Insufficient documentation

## 2014-09-01 DIAGNOSIS — Y9289 Other specified places as the place of occurrence of the external cause: Secondary | ICD-10-CM | POA: Insufficient documentation

## 2014-09-01 DIAGNOSIS — Z8619 Personal history of other infectious and parasitic diseases: Secondary | ICD-10-CM | POA: Insufficient documentation

## 2014-09-01 DIAGNOSIS — Y9389 Activity, other specified: Secondary | ICD-10-CM | POA: Insufficient documentation

## 2014-09-01 DIAGNOSIS — Y998 Other external cause status: Secondary | ICD-10-CM | POA: Insufficient documentation

## 2014-09-01 DIAGNOSIS — Z23 Encounter for immunization: Secondary | ICD-10-CM | POA: Insufficient documentation

## 2014-09-01 DIAGNOSIS — Z8719 Personal history of other diseases of the digestive system: Secondary | ICD-10-CM | POA: Insufficient documentation

## 2014-09-01 DIAGNOSIS — Z79899 Other long term (current) drug therapy: Secondary | ICD-10-CM | POA: Insufficient documentation

## 2014-09-01 DIAGNOSIS — T1490XA Injury, unspecified, initial encounter: Secondary | ICD-10-CM

## 2014-09-01 DIAGNOSIS — S8991XA Unspecified injury of right lower leg, initial encounter: Secondary | ICD-10-CM

## 2014-09-01 DIAGNOSIS — S81011A Laceration without foreign body, right knee, initial encounter: Secondary | ICD-10-CM | POA: Insufficient documentation

## 2014-09-01 NOTE — ED Notes (Signed)
Pt reporting right knee pain after lifting weights.  Sts he noticed some abrasions to his right knee and is afraid there may be some debris in his knee.  Sts it feels hard when touching.

## 2014-09-02 MED ORDER — TETANUS-DIPHTH-ACELL PERTUSSIS 5-2.5-18.5 LF-MCG/0.5 IM SUSP
0.5000 mL | Freq: Once | INTRAMUSCULAR | Status: AC
  Administered 2014-09-02: 0.5 mL via INTRAMUSCULAR
  Filled 2014-09-02: qty 0.5

## 2014-09-02 NOTE — ED Provider Notes (Signed)
CSN: [id number]     Arrival date & time 09/01/14  2112 History   First MD Initiated Contact with Patient 09/01/14 2353     Chief Complaint  Patient presents with  . Knee Injury     (Consider location/radiation/quality/duration/timing/severity/associated sxs/prior Treatment) HPI  Shawn Howard is a 29 y.o. male presenting with injury to right knee after lifting weights today. Patient stated he cut his right anterior lateral proximal knee on a piece of black plastic on weight equipment. He's concerned that a piece of black plastic is in his laceration. He denies any pain no numbness or tingling. No significant redness or swelling. No fevers chills nausea vomiting. He states his last tetanus was in 2004.   Past Medical History  Diagnosis Date  . Shingles   . Pyloric stenosis    Past Surgical History  Procedure Laterality Date  . Abdominal surgery      pyloric stenosis   Family History  Problem Relation Age of Onset  . Diabetes Mother   . Hypertension Father    History  Substance Use Topics  . Smoking status: Never Smoker   . Smokeless tobacco: Never Used  . Alcohol Use: No    Review of Systems  Musculoskeletal: Negative for myalgias and joint swelling.  Skin: Positive for wound. Negative for color change.  Neurological: Negative for weakness and numbness.      Allergies  Peanut-containing drug products  Home Medications   Prior to Admission medications   Medication Sig Start Date End Date Taking? Authorizing Provider  clotrimazole (LOTRIMIN) 1 % cream Use on affected area. Do not use on your face. 09/29/13   Cleatrice Burke, PA-C  loperamide (IMODIUM) 2 MG capsule Take 1 capsule (2 mg total) by mouth 4 (four) times daily as needed for diarrhea or loose stools. 06/13/13   Lutricia Feil, PA  Multiple Vitamin (MULTIVITAMIN WITH MINERALS) TABS tablet Take 1 tablet by mouth daily.    Historical Provider, MD  naproxen (NAPROSYN) 500 MG tablet Take 1 tablet (500 mg  total) by mouth 2 (two) times daily as needed. 05/28/13   Robynn Pane, MD  orphenadrine (NORFLEX) 100 MG tablet Take 1 tablet (100 mg total) by mouth 2 (two) times daily as needed for muscle spasms. 05/28/13   Robynn Pane, MD  propranolol (INDERAL) 20 MG tablet Take one tablet as needed for palpitations or situational anxiety. 07/15/13   Angelica Ran, MD   BP 118/55 mmHg  Pulse 80  Temp(Src) 98.7 F (37.1 C) (Oral)  Resp 18  SpO2 100% Physical Exam  Constitutional: He appears well-developed and well-nourished. No distress.  HENT:  Head: Normocephalic and atraumatic.  Eyes: Conjunctivae are normal. Right eye exhibits no discharge. Left eye exhibits no discharge.  Pulmonary/Chest: Effort normal. No respiratory distress.  Musculoskeletal:  No tenderness or effusion to right knee. No ligamentous laxity strength and sensation intact. Full range of motion. Superficial less than 1 cm laceration to proximal lateral right knee with black plastic foreign body.  Neurological: He is alert. Coordination normal.  Skin: He is not diaphoretic.  Psychiatric: He has a normal mood and affect. His behavior is normal.  Nursing note and vitals reviewed.   ED Course  Procedures (including critical care time) Labs Review Labs Reviewed - No data to display  Imaging Review Dg Knee 1-2 Views Right  09/01/2014   CLINICAL DATA:  Right knee pain after lifting weights. Right knee abrasions. Initial encounter.  EXAM: RIGHT KNEE - 1-2  VIEW  COMPARISON:  None.  FINDINGS: The mineralization and alignment are normal. There is no evidence of acute fracture or dislocation. The joint spaces are maintained. There is no evidence of joint effusion or foreign body. There is possible mild subcutaneous edema anterior to the quadriceps tendon on the lateral view.  IMPRESSION: No acute osseous findings or evidence of foreign body.   Electronically Signed   By: Richardean Sale M.D.   On: 09/01/2014 23:01     EKG  Interpretation None     LACERATION REPAIR Performed by: Paxton Kanaan Authorized by: Al Corpus Consent: Verbal consent obtained. Risks and benefits: risks, benefits and alternatives were discussed Consent given by: patient Patient identity confirmed: provided demographic data Prepped and Draped in normal sterile fashion Wound explored  Laceration Location: Proximal right lateral knee  Laceration Length: Less than 1 cm cm  No Foreign Bodies seen or palpated  Local anesthetic: None   Irrigation method: syringe Amount of cleaning: standard  Skin closure: Steri-Strips   Patient tolerance: Patient tolerated the procedure well with no immediate complications.   MDM   Final diagnoses:  Knee injury, right, initial encounter  Laceration of knee, right, initial encounter   Patient presenting with superficial laceration to right knee with concern of foreign body. VSS. Knee neurovascularly intact. X-ray without any evidence of acute osseous abnormality or foreign body. Laceration was irrigated and small less than 1 cm piece of black plastic removed from the area. No other foreign bodies visualized. Patient was irrigated and repaired with Steri-Strips. Patient instructed to return with symptoms of infection. And follow-up with urgent care for wound recheck in 2-4 days. No antibiotics indicated. Tetanus updated.  Discussed return precautions with patient. Discussed all results and patient verbalizes understanding and agrees with plan.    Al Corpus, PA-C 09/02/14 [place], PA-C 09/02/14 0123  Tanna Furry, MD 09/04/14 [phone]

## 2014-09-02 NOTE — Discharge Instructions (Signed)
Return to the emergency room with worsening of symptoms, new symptoms or with symptoms that are concerning. Keep wound dry and do not remove dressing for 24 hours if possible.    Do NOT use rubbing alcohol or hydrogen peroxide, do not soak the area   Every attempt was made to remove foreign body (contaminants) from the wound.  However, there is always a chance that some may remain in the wound. This can  increase your risk of infection.   If you see signs of infection (warmth, redness, tenderness, pus, sharp increase in pain, fever, red streaking in the skin) immediately return to the emergency department.   After the wound heals fully, apply sunscreen for 6-12 months to minimize scarring.  Read below information and follow recommendations. Laceration Care, Adult A laceration is a cut or lesion that goes through all layers of the skin and into the tissue just beneath the skin. TREATMENT  Some lacerations may not require closure. Some lacerations may not be able to be closed due to an increased risk of infection. It is important to see your caregiver as soon as possible after an injury to minimize the risk of infection and maximize the opportunity for successful closure. If closure is appropriate, pain medicines may be given, if needed. The wound will be cleaned to help prevent infection. Your caregiver will use stitches (sutures), staples, wound glue (adhesive), or skin adhesive strips to repair the laceration. These tools bring the skin edges together to allow for faster healing and a better cosmetic outcome. However, all wounds will heal with a scar. Once the wound has healed, scarring can be minimized by covering the wound with sunscreen during the day for 1 full year. HOME CARE INSTRUCTIONS  For sutures or staples:  Keep the wound clean and dry.  If you were given a bandage (dressing), you should change it at least once a day. Also, change the dressing if it becomes wet or dirty, or as  directed by your caregiver.  Wash the wound with soap and water 2 times a day. Rinse the wound off with water to remove all soap. Pat the wound dry with a clean towel.  After cleaning, apply a thin layer of the antibiotic ointment as recommended by your caregiver. This will help prevent infection and keep the dressing from sticking.  You may shower as usual after the first 24 hours. Do not soak the wound in water until the sutures are removed.  Only take over-the-counter or prescription medicines for pain, discomfort, or fever as directed by your caregiver.  Get your sutures or staples removed as directed by your caregiver. For skin adhesive strips:  Keep the wound clean and dry.  Do not get the skin adhesive strips wet. You may bathe carefully, using caution to keep the wound dry.  If the wound gets wet, pat it dry with a clean towel.  Skin adhesive strips will fall off on their own. You may trim the strips as the wound heals. Do not remove skin adhesive strips that are still stuck to the wound. They will fall off in time. For wound adhesive:  You may briefly wet your wound in the shower or bath. Do not soak or scrub the wound. Do not swim. Avoid periods of heavy perspiration until the skin adhesive has fallen off on its own. After showering or bathing, gently pat the wound dry with a clean towel.  Do not apply liquid medicine, cream medicine, or ointment medicine to your  wound while the skin adhesive is in place. This may loosen the film before your wound is healed.  If a dressing is placed over the wound, be careful not to apply tape directly over the skin adhesive. This may cause the adhesive to be pulled off before the wound is healed.  Avoid prolonged exposure to sunlight or tanning lamps while the skin adhesive is in place. Exposure to ultraviolet light in the first year will darken the scar.  The skin adhesive will usually remain in place for 5 to 10 days, then naturally fall  off the skin. Do not pick at the adhesive film. You may need a tetanus shot if:  You cannot remember when you had your last tetanus shot.  You have never had a tetanus shot. If you get a tetanus shot, your arm may swell, get red, and feel warm to the touch. This is common and not a problem. If you need a tetanus shot and you choose not to have one, there is a rare chance of getting tetanus. Sickness from tetanus can be serious. SEEK MEDICAL CARE IF:   You have redness, swelling, or increasing pain in the wound.  You see a red line that goes away from the wound.  You have yellowish-white fluid (pus) coming from the wound.  You have a fever.  You notice a bad smell coming from the wound or dressing.  Your wound breaks open before or after sutures have been removed.  You notice something coming out of the wound such as wood or glass.  Your wound is on your hand or foot and you cannot move a finger or toe. SEEK IMMEDIATE MEDICAL CARE IF:   Your pain is not controlled with prescribed medicine.  You have severe swelling around the wound causing pain and numbness or a change in color in your arm, hand, leg, or foot.  Your wound splits open and starts bleeding.  You have worsening numbness, weakness, or loss of function of any joint around or beyond the wound.  You develop painful lumps near the wound or on the skin anywhere on your body. MAKE SURE YOU:   Understand these instructions.  Will watch your condition.  Will get help right away if you are not doing well or get worse. Document Released: 06/10/2005 Document Revised: 09/02/2011 Document Reviewed: 12/04/2010 [facility] Patient Information 2015 [place], [place]. This information is not intended to replace advice given to you by your health care provider. Make sure you discuss any questions you have with your health care provider.

## 2014-10-17 ENCOUNTER — Encounter (HOSPITAL_COMMUNITY): Payer: Self-pay | Admitting: Family Medicine

## 2014-10-17 ENCOUNTER — Emergency Department (HOSPITAL_COMMUNITY)
Admission: EM | Admit: 2014-10-17 | Discharge: 2014-10-17 | Disposition: A | Discharge status: Home / Self Care | Payer: Self-pay | Attending: Emergency Medicine | Admitting: Emergency Medicine

## 2014-10-17 DIAGNOSIS — M5440 Lumbago with sciatica, unspecified side: Secondary | ICD-10-CM | POA: Insufficient documentation

## 2014-10-17 DIAGNOSIS — J029 Acute pharyngitis, unspecified: Secondary | ICD-10-CM | POA: Insufficient documentation

## 2014-10-17 DIAGNOSIS — J069 Acute upper respiratory infection, unspecified: Secondary | ICD-10-CM

## 2014-10-17 DIAGNOSIS — M545 Low back pain, unspecified: Secondary | ICD-10-CM

## 2014-10-17 DIAGNOSIS — Z8619 Personal history of other infectious and parasitic diseases: Secondary | ICD-10-CM | POA: Insufficient documentation

## 2014-10-17 DIAGNOSIS — Z79899 Other long term (current) drug therapy: Secondary | ICD-10-CM | POA: Insufficient documentation

## 2014-10-17 LAB — I-STAT CHEM 8, ED
BUN: 14 mg/dL (ref 6–23)
Calcium, Ion: 1.2 mmol/L (ref 1.12–1.23)
Chloride: 99 mmol/L (ref 96–112)
Creatinine, Ser: 1.1 mg/dL (ref 0.50–1.35)
Glucose, Bld: 92 mg/dL (ref 70–99)
HCT: 52 % (ref 39.0–52.0)
Hemoglobin: 17.7 g/dL — ABNORMAL HIGH (ref 13.0–17.0)
Potassium: 4.2 mmol/L (ref 3.5–5.1)
Sodium: 139 mmol/L (ref 135–145)
TCO2: 27 mmol/L (ref 0–100)

## 2014-10-17 LAB — URINALYSIS, ROUTINE W REFLEX MICROSCOPIC
Bilirubin Urine: NEGATIVE
Glucose, UA: NEGATIVE mg/dL
Hgb urine dipstick: NEGATIVE
Ketones, ur: NEGATIVE mg/dL
Leukocytes, UA: NEGATIVE
Nitrite: NEGATIVE
Protein, ur: NEGATIVE mg/dL
Specific Gravity, Urine: 1.013 (ref 1.005–1.030)
Urobilinogen, UA: 0.2 mg/dL (ref 0.0–1.0)
pH: 6 (ref 5.0–8.0)

## 2014-10-17 MED ORDER — IBUPROFEN 600 MG PO TABS: 600.0000 mg | ORAL_TABLET | Freq: Four times a day (QID) | ORAL | Status: DC | PRN

## 2014-10-17 MED ORDER — METHOCARBAMOL 500 MG PO TABS: 500.0000 mg | ORAL_TABLET | Freq: Two times a day (BID) | ORAL | Status: DC

## 2014-10-17 MED ORDER — KETOROLAC TROMETHAMINE 30 MG/ML IJ SOLN
30.0000 mg | Freq: Once | INTRAMUSCULAR | Status: AC
  Administered 2014-10-17: 30 mg via INTRAVENOUS
  Filled 2014-10-17: qty 1

## 2014-10-17 MED ORDER — SODIUM CHLORIDE 0.9 % IV BOLUS (SEPSIS)
1000.0000 mL | Freq: Once | INTRAVENOUS | Status: AC
  Administered 2014-10-17: 1000 mL via INTRAVENOUS

## 2014-10-17 NOTE — Discharge Instructions (Signed)
Please follow up with your primary care physician in 1-2 days. If you do not have one please call the [facility] number listed above. Please take pain medication and/or muscle relaxants as prescribed and as needed for pain. Please do not drive on narcotic pain medication or on muscle relaxants. Please read all discharge instructions and return precautions.   Back Pain, Adult Low back pain is very common. About 1 in 5 people have back pain.The cause of low back pain is rarely dangerous. The pain often gets better over time.About half of people with a sudden onset of back pain feel better in just 2 weeks. About 8 in 10 people feel better by 6 weeks.  CAUSES Some common causes of back pain include:  Strain of the muscles or ligaments supporting the spine.  Wear and tear (degeneration) of the spinal discs.  Arthritis.  Direct injury to the back. DIAGNOSIS Most of the time, the direct cause of low back pain is not known.However, back pain can be treated effectively even when the exact cause of the pain is unknown.Answering your caregiver's questions about your overall health and symptoms is one of the most accurate ways to make sure the cause of your pain is not dangerous. If your caregiver needs more information, he or she may order lab work or imaging tests (X-rays or MRIs).However, even if imaging tests show changes in your back, this usually does not require surgery. HOME CARE INSTRUCTIONS For many people, back pain returns.Since low back pain is rarely dangerous, it is often a condition that people can learn to [facility] their own.   Remain active. It is stressful on the back to sit or stand in one place. Do not sit, drive, or stand in one place for more than 30 minutes at a time. Take short walks on level surfaces as soon as pain allows.Try to increase the length of time you walk each day.  Do not stay in bed.Resting more than 1 or 2 days can delay your  recovery.  Do not avoid exercise or work.Your body is made to move.It is not dangerous to be active, even though your back may hurt.Your back will likely heal faster if you return to being active before your pain is gone.  Pay attention to your body when you bend and lift. Many people have less discomfortwhen lifting if they bend their knees, keep the load close to their bodies,and avoid twisting. Often, the most comfortable positions are those that put less stress on your recovering back.  Find a comfortable position to sleep. Use a firm mattress and lie on your side with your knees slightly bent. If you lie on your back, put a pillow under your knees.  Only take over-the-counter or prescription medicines as directed by your caregiver. Over-the-counter medicines to reduce pain and inflammation are often the most helpful.Your caregiver may prescribe muscle relaxant drugs.These medicines help dull your pain so you can more quickly return to your normal activities and healthy exercise.  Put ice on the injured area.  Put ice in a plastic bag.  Place a towel between your skin and the bag.  Leave the ice on for 15-20 minutes, 03-04 times a day for the first 2 to 3 days. After that, ice and heat may be alternated to reduce pain and spasms.  Ask your caregiver about trying back exercises and gentle massage. This may be of some benefit.  Avoid feeling anxious or stressed.Stress increases muscle tension and  can worsen back pain.It is important to recognize when you are anxious or stressed and learn ways to manage it.Exercise is a great option. SEEK MEDICAL CARE IF:  You have pain that is not relieved with rest or medicine.  You have pain that does not improve in 1 week.  You have new symptoms.  You are generally not feeling well. SEEK IMMEDIATE MEDICAL CARE IF:   You have pain that radiates from your back into your legs.  You develop new bowel or bladder control problems.  You  have unusual weakness or numbness in your arms or legs.  You develop nausea or vomiting.  You develop abdominal pain.  You feel faint. Document Released: 06/10/2005 Document Revised: 12/10/2011 Document Reviewed: 10/12/2013 [facility] Patient Information 2015 [place], [place]. This information is not intended to replace advice given to you by your health care provider. Make sure you discuss any questions you have with your health care provider. Upper Respiratory Infection, Adult An upper respiratory infection (URI) is also sometimes known as the common cold. The upper respiratory tract includes the nose, sinuses, throat, trachea, and bronchi. Bronchi are the airways leading to the lungs. Most people improve within 1 week, but symptoms can last up to 2 weeks. A residual cough may last even longer.  CAUSES Many different viruses can infect the tissues lining the upper respiratory tract. The tissues become irritated and inflamed and often become very moist. Mucus production is also common. A cold is contagious. You can easily spread the virus to others by oral contact. This includes kissing, sharing a glass, coughing, or sneezing. Touching your mouth or nose and then touching a surface, which is then touched by another person, can also spread the virus. SYMPTOMS  Symptoms typically develop 1 to 3 days after you come in contact with a cold virus. Symptoms vary from person to person. They may include:  Runny nose.  Sneezing.  Nasal congestion.  Sinus irritation.  Sore throat.  Loss of voice (laryngitis).  Cough.  Fatigue.  Muscle aches.  Loss of appetite.  Headache.  Low-grade fever. DIAGNOSIS  You might diagnose your own cold based on familiar symptoms, since most people get a cold 2 to 3 times a year. Your caregiver can confirm this based on your exam. Most importantly, your caregiver can check that your symptoms are not due to another disease such as strep throat, sinusitis,  pneumonia, asthma, or epiglottitis. Blood tests, throat tests, and X-rays are not necessary to diagnose a common cold, but they may sometimes be helpful in excluding other more serious diseases. Your caregiver will decide if any further tests are required. RISKS AND COMPLICATIONS  You may be at risk for a more severe case of the common cold if you smoke cigarettes, have chronic heart disease (such as heart failure) or lung disease (such as asthma), or if you have a weakened immune system. The very young and very old are also at risk for more serious infections. Bacterial sinusitis, middle ear infections, and bacterial pneumonia can complicate the common cold. The common cold can worsen asthma and chronic obstructive pulmonary disease (COPD). Sometimes, these complications can require emergency medical care and may be life-threatening. PREVENTION  The best way to protect against getting a cold is to practice good hygiene. Avoid oral or hand contact with people with cold symptoms. Wash your hands often if contact occurs. There is no clear evidence that vitamin C, vitamin E, echinacea, or exercise reduces the chance of developing a cold.  However, it is always recommended to get plenty of rest and practice good nutrition. TREATMENT  Treatment is directed at relieving symptoms. There is no cure. Antibiotics are not effective, because the infection is caused by a virus, not by bacteria. Treatment may include:  Increased fluid intake. Sports drinks offer valuable electrolytes, sugars, and fluids.  Breathing heated mist or steam (vaporizer or shower).  Eating chicken soup or other clear broths, and maintaining good nutrition.  Getting plenty of rest.  Using gargles or lozenges for comfort.  Controlling fevers with ibuprofen or acetaminophen as directed by your caregiver.  Increasing usage of your inhaler if you have asthma. Zinc gel and zinc lozenges, taken in the first 24 hours of the common cold, can  shorten the duration and lessen the severity of symptoms. Pain medicines may help with fever, muscle aches, and throat pain. A variety of non-prescription medicines are available to treat congestion and runny nose. Your caregiver can make recommendations and may suggest nasal or lung inhalers for other symptoms.  HOME CARE INSTRUCTIONS   Only take over-the-counter or prescription medicines for pain, discomfort, or fever as directed by your caregiver.  Use a warm mist humidifier or inhale steam from a shower to increase air moisture. This may keep secretions moist and make it easier to breathe.  Drink enough water and fluids to keep your urine clear or pale yellow.  Rest as needed.  Return to work when your temperature has returned to normal or as your caregiver advises. You may need to stay home longer to avoid infecting others. You can also use a face mask and careful hand washing to prevent spread of the virus. SEEK MEDICAL CARE IF:   After the first few days, you feel you are getting worse rather than better.  You need your caregiver's advice about medicines to control symptoms.  You develop chills, worsening shortness of breath, or brown or red sputum. These may be signs of pneumonia.  You develop yellow or brown nasal discharge or pain in the face, especially when you bend forward. These may be signs of sinusitis.  You develop a fever, swollen neck glands, pain with swallowing, or white areas in the back of your throat. These may be signs of strep throat. SEEK IMMEDIATE MEDICAL CARE IF:   You have a fever.  You develop severe or persistent headache, ear pain, sinus pain, or chest pain.  You develop wheezing, a prolonged cough, cough up blood, or have a change in your usual mucus (if you have chronic lung disease).  You develop sore muscles or a stiff neck. Document Released: 12/04/2000 Document Revised: 09/02/2011 Document Reviewed: 09/15/2013 [facility] Patient Information 2015  [place], [place]. This information is not intended to replace advice given to you by your health care provider. Make sure you discuss any questions you have with your health care provider.

## 2014-10-17 NOTE — ED Notes (Signed)
Pt sts congestion, body aches and dizziness. sts this am he felt like he was going to pass out.

## 2014-10-17 NOTE — ED Provider Notes (Signed)
CSN: [id number]     Arrival date & time 10/17/14  1122 History   First MD Initiated Contact with Patient 10/17/14 1222     Chief Complaint  Patient presents with  . Generalized Body Aches  . Nasal Congestion  . Dizziness     (Consider location/radiation/quality/duration/timing/severity/associated sxs/prior Treatment) HPI Comments: Patient is a 29 year old male presenting to the emergency department for evaluation of an episode of lightheadedness earlier today while walking. He states he's had a few days of nasal congestion, rhinorrhea, nonproductive cough, body aches. He states he developed left-sided low back pain without radiation into the abdomen with associated increased urinary frequency. States he has tried over-the-counter cough and cold medication with improvement of his URI symptoms. No modifying factors for back pain identified. Denies any fevers, bladder or bowel incontinence, numbness or weakness. States his history of kidney stones.  Patient is a 29 y.o. male presenting with dizziness.  Dizziness Associated symptoms: nausea     Past Medical History  Diagnosis Date  . Shingles   . Pyloric stenosis    Past Surgical History  Procedure Laterality Date  . Abdominal surgery      pyloric stenosis   Family History  Problem Relation Age of Onset  . Diabetes Mother   . Hypertension Father    History  Substance Use Topics  . Smoking status: Never Smoker   . Smokeless tobacco: Never Used  . Alcohol Use: No    Review of Systems  HENT: Positive for congestion and sinus pressure.   Gastrointestinal: Positive for nausea.  Musculoskeletal: Positive for back pain.  Neurological: Positive for dizziness.  All other systems reviewed and are negative.     Allergies  Review of patient's allergies indicates no known allergies.  Home Medications   Prior to Admission medications   Medication Sig Start Date End Date Taking? Authorizing Provider  Multiple Vitamin  (MULTIVITAMIN WITH MINERALS) TABS tablet Take 1 tablet by mouth daily.   Yes Historical Provider, MD  Nutritional Supplements (COLD AND FLU PO) Take 5 mLs by mouth at bedtime as needed (flu symptoms).   Yes Historical Provider, MD  clotrimazole (LOTRIMIN) 1 % cream Use on affected area. Do not use on your face. Patient not taking: Reported on 10/17/2014 09/29/13   Cleatrice Burke, PA-C  ibuprofen (ADVIL,MOTRIN) 600 MG tablet Take 1 tablet (600 mg total) by mouth every 6 (six) hours as needed. 10/17/14   Bristol Osentoski, PA-C  loperamide (IMODIUM) 2 MG capsule Take 1 capsule (2 mg total) by mouth 4 (four) times daily as needed for diarrhea or loose stools. Patient not taking: Reported on 10/17/2014 06/13/13   Audelia Hives Presson, PA  methocarbamol (ROBAXIN) 500 MG tablet Take 1 tablet (500 mg total) by mouth 2 (two) times daily. 10/17/14   Shequila Neglia, PA-C  naproxen (NAPROSYN) 500 MG tablet Take 1 tablet (500 mg total) by mouth 2 (two) times daily as needed. Patient not taking: Reported on 10/17/2014 05/28/13   Robynn Pane, MD  orphenadrine (NORFLEX) 100 MG tablet Take 1 tablet (100 mg total) by mouth 2 (two) times daily as needed for muscle spasms. Patient not taking: Reported on 10/17/2014 05/28/13   Robynn Pane, MD  propranolol (INDERAL) 20 MG tablet Take one tablet as needed for palpitations or situational anxiety. Patient not taking: Reported on 10/17/2014 07/15/13   Angelica Ran, MD   BP 153/57 mmHg  Pulse 81  Temp(Src) 99.2 F (37.3 C)  Resp 14  SpO2 100% Physical Exam  Constitutional: He is oriented to person, place, and time. He appears well-developed and well-nourished. No distress.  HENT:  Head: Normocephalic and atraumatic.  Right Ear: External ear normal.  Left Ear: External ear normal.  Nose: Nose normal.  Mouth/Throat: Oropharynx is clear and moist. No oropharyngeal exudate.  Eyes: Conjunctivae and EOM are normal. Pupils are equal, round, and reactive to light.   Neck: Normal range of motion. Neck supple.  Cardiovascular: Normal rate, regular rhythm, normal heart sounds and intact distal pulses.   Pulmonary/Chest: Effort normal and breath sounds normal. No respiratory distress.  Abdominal: Soft. Bowel sounds are normal. There is no tenderness. There is no rigidity, no rebound and no guarding.  Musculoskeletal: Normal range of motion.       Cervical back: Normal.       Thoracic back: Normal.       Lumbar back: He exhibits tenderness. He exhibits no bony tenderness and no deformity.  Neurological: He is alert and oriented to person, place, and time. He has normal strength. No cranial nerve deficit. Gait normal. GCS eye subscore is 4. GCS verbal subscore is 5. GCS motor subscore is 6.  Sensation grossly intact.    Skin: Skin is warm and dry. He is not diaphoretic.  Nursing note and vitals reviewed.   ED Course  Procedures (including critical care time) Medications  sodium chloride 0.9 % bolus 1,000 mL (0 mLs Intravenous Stopped 10/17/14 1432)  ketorolac (TORADOL) 30 MG/ML injection 30 mg (30 mg Intravenous Given 10/17/14 1306)    Labs Review Labs Reviewed  I-STAT CHEM 8, ED - Abnormal; Notable for the following:    Hemoglobin 17.7 (*)    All other components within normal limits  URINE CULTURE  URINALYSIS, ROUTINE W REFLEX MICROSCOPIC    Imaging Review No results found.   EKG Interpretation None      No orthostasis noted.  MDM   Final diagnoses:  URI (upper respiratory infection)  Left-sided low back pain without sciatica    Filed Vitals:   10/17/14 1429  BP: 153/57  Pulse: 81  Temp: 99.2 F (37.3 C)  Resp: 14   Afebrile, NAD, non-toxic appearing, AAOx4.  I have reviewed nursing notes, vital signs, and all appropriate lab and imaging results for this patient. No neurofocal deficits.  1) URI: Patients symptoms are consistent with URI, likely viral etiology. No hypoxia or fever to suggest pneumonia. Lungs clear to  auscultation bilaterally. No nuchal rigidity or toxicities to suggest meningitis. Discussed that antibiotics are not indicated for viral infections.    2) Back pain: Patient with back pain.  No neurological deficits and normal neuro exam.  Patient can walk but states is painful.  No loss of bowel or bladder control.  No concern for cauda equina.  No fever, night sweats, weight loss, h/o cancer, IVDU.  RICE protocol and pain medicine indicated and discussed with patient.   Pt will be discharged with symptomatic treatment.  Parent verbalizes understanding and is agreeable with plan. Pt is hemodynamically stable at time of discharge.     Baron Sane, PA-C 10/17/14 1448  Ripley Fraise, MD 10/17/14 [phone]

## 2014-10-18 LAB — URINE CULTURE
Colony Count: NO GROWTH
Culture: NO GROWTH

## 2016-12-30 ENCOUNTER — Encounter: Payer: Self-pay | Admitting: Urgent Care

## 2016-12-30 ENCOUNTER — Ambulatory Visit (INDEPENDENT_AMBULATORY_CARE_PROVIDER_SITE_OTHER): Payer: Managed Care, Other (non HMO) | Admitting: Urgent Care

## 2016-12-30 VITALS — BP 126/85 | HR 82 | Temp 98.1°F | Resp 18 | Ht 68.0 in | Wt 152.4 lb

## 2016-12-30 DIAGNOSIS — F419 Anxiety disorder, unspecified: Secondary | ICD-10-CM | POA: Diagnosis not present

## 2016-12-30 DIAGNOSIS — Z0000 Encounter for general adult medical examination without abnormal findings: Secondary | ICD-10-CM

## 2016-12-30 DIAGNOSIS — Z113 Encounter for screening for infections with a predominantly sexual mode of transmission: Secondary | ICD-10-CM | POA: Diagnosis not present

## 2016-12-30 NOTE — Progress Notes (Signed)
MRN: [id number]  Subjective:   [name] is a 31 y.o. male presenting for annual physical exam. Patient is married, has 3 biological children and 4 step children. Works as a Administrator. Has good relationships at home, has a good support network. Denies smoking cigarettes or drinking alcohol.  Medical care team includes: PCP: Jaynee Eagles, PA-C Vision: Wears glasses, gets eye exams yearly. Dental: Gets dental cleanings every 6 months. Specialists: None.  Daryan is not currently taking any medications.  He has No Known Allergies. Saadiq  has a past medical history of Anxiety; Pyloric stenosis; and Shingles. Also  has a past surgical history that includes Abdominal surgery. His family history includes Diabetes in his mother; Hypertension in his father.  Immunizations: tdap was completed 09/02/2014  Review of Systems  Constitutional: Negative for chills, diaphoresis, fever, malaise/fatigue and weight loss.  HENT: Negative for congestion, ear discharge, ear pain, hearing loss, nosebleeds, sore throat and tinnitus.   Eyes: Negative for blurred vision, double vision, photophobia, pain, discharge and redness.  Respiratory: Negative for cough, shortness of breath and wheezing.   Cardiovascular: Negative for chest pain, palpitations and leg swelling.  Gastrointestinal: Negative for abdominal pain, blood in stool, constipation, diarrhea, nausea and vomiting.  Genitourinary: Negative for dysuria, flank pain, frequency, hematuria and urgency.  Musculoskeletal: Negative for back pain, joint pain and myalgias.  Skin: Negative for itching and rash.  Neurological: Negative for dizziness, tingling, seizures, loss of consciousness, weakness and headaches.  Endo/Heme/Allergies: Negative for polydipsia.  Psychiatric/Behavioral: Negative for depression, hallucinations, memory loss, substance abuse and suicidal ideas. The patient is not nervous/anxious and does not have insomnia.    Objective:    Vitals: BP 126/85 (BP Location: Right Arm, Patient Position: Sitting, Cuff Size: Normal)   Pulse 82   Temp 98.1 F (36.7 C) (Oral)   Resp 18   Ht 5\' 8"  (1.727 m)   Wt 152 lb 6.4 oz (69.1 kg)   SpO2 100%   BMI 23.17 kg/m   BP Readings from Last 3 Encounters:  12/30/16 126/85  10/17/14 153/57  09/02/14 115/65    Physical Exam  Constitutional: He is oriented to person, place, and time. He appears well-developed and well-nourished.  HENT:  TM's intact bilaterally, no effusions or erythema. Nasal turbinates pink and moist, nasal passages patent. No sinus tenderness. Oropharynx clear, mucous membranes moist, dentition in good repair.  Eyes: Conjunctivae and EOM are normal. Pupils are equal, round, and reactive to light. Right eye exhibits no discharge. Left eye exhibits no discharge. No scleral icterus.  Neck: Normal range of motion. Neck supple. No thyromegaly present.  Cardiovascular: Normal rate, regular rhythm and intact distal pulses.  Exam reveals no gallop and no friction rub.   No murmur heard. Pulmonary/Chest: No stridor. No respiratory distress. He has no wheezes. He has no rales.  Abdominal: Soft. Bowel sounds are normal. He exhibits no distension and no mass. There is no tenderness.  Musculoskeletal: Normal range of motion. He exhibits no edema or tenderness.  Lymphadenopathy:    He has no cervical adenopathy.  Neurological: He is alert and oriented to person, place, and time. He has normal reflexes. He displays normal reflexes. Coordination normal.  Skin: Skin is warm and dry. Capillary refill takes less than 2 seconds. No rash noted. No erythema. No pallor.  Psychiatric: He has a normal mood and affect.   Assessment and Plan :   1. Annual physical exam - Patient is very pleasant. Discussed healthy lifestyle, diet,  exercise, preventative care, vaccinations, and addressed patient's concerns.  - Comprehensive metabolic panel - TSH - Lipid panel - CBC  2. Routine  screening for STI (sexually transmitted infection) - Labs pending. - HIV antibody - GC/Chlamydia Probe Amp - RPR - Trichomonas vaginalis, RNA  3. Anxiety - Stable, monitor.  Jaynee Eagles, PA-C [facility] [phone] 12/30/2016  9:43 AM

## 2016-12-30 NOTE — Patient Instructions (Addendum)

## 2016-12-31 LAB — COMPREHENSIVE METABOLIC PANEL
ALT: 17 IU/L (ref 0–44)
AST: 12 IU/L (ref 0–40)
Albumin/Globulin Ratio: 1 — ABNORMAL LOW (ref 1.2–2.2)
Albumin: 4.2 g/dL (ref 3.5–5.5)
Alkaline Phosphatase: 46 IU/L (ref 39–117)
BUN/Creatinine Ratio: 14 (ref 9–20)
BUN: 14 mg/dL (ref 6–20)
Bilirubin Total: 0.5 mg/dL (ref 0.0–1.2)
CO2: 22 mmol/L (ref 20–29)
Calcium: 10 mg/dL (ref 8.7–10.2)
Chloride: 101 mmol/L (ref 96–106)
Creatinine, Ser: 1 mg/dL (ref 0.76–1.27)
GFR calc Af Amer: 116 mL/min/{1.73_m2} (ref >59)
GFR calc non Af Amer: 101 mL/min/{1.73_m2} (ref >59)
Globulin, Total: 4.1 g/dL (ref 1.5–4.5)
Glucose: 87 mg/dL (ref 65–99)
Potassium: 4.6 mmol/L (ref 3.5–5.2)
Sodium: 139 mmol/L (ref 134–144)
Total Protein: 8.3 g/dL (ref 6.0–8.5)

## 2016-12-31 LAB — LIPID PANEL
Chol/HDL Ratio: 1.8 ratio (ref 0.0–5.0)
Cholesterol, Total: 131 mg/dL (ref 100–199)
HDL: 72 mg/dL (ref >39)
LDL Calculated: 53 mg/dL (ref 0–99)
Triglycerides: 31 mg/dL (ref 0–149)
VLDL Cholesterol Cal: 6 mg/dL (ref 5–40)

## 2016-12-31 LAB — CBC
Hematocrit: 44.5 % (ref 37.5–51.0)
Hemoglobin: 14.6 g/dL (ref 13.0–17.7)
MCH: 29.7 pg (ref 26.6–33.0)
MCHC: 32.8 g/dL (ref 31.5–35.7)
MCV: 90 fL (ref 79–97)
Platelets: 283 10*3/uL (ref 150–379)
RBC: 4.92 x10E6/uL (ref 4.14–5.80)
RDW: 14.1 % (ref 12.3–15.4)
WBC: 5.2 10*3/uL (ref 3.4–10.8)

## 2016-12-31 LAB — HIV ANTIBODY (ROUTINE TESTING W REFLEX): HIV Screen 4th Generation wRfx: NONREACTIVE

## 2016-12-31 LAB — GC/CHLAMYDIA PROBE AMP
Chlamydia trachomatis, NAA: NEGATIVE
Neisseria gonorrhoeae by PCR: NEGATIVE

## 2016-12-31 LAB — RPR: RPR Ser Ql: NONREACTIVE

## 2016-12-31 LAB — TSH: TSH: 0.69 u[IU]/mL (ref 0.450–4.500)

## 2017-01-01 LAB — TRICHOMONAS VAGINALIS, PROBE AMP: Trich vag by NAA: NEGATIVE

## 2017-03-30 ENCOUNTER — Emergency Department (HOSPITAL_COMMUNITY)
Admission: EM | Admit: 2017-03-30 | Discharge: 2017-03-30 | Disposition: A | Discharge status: Home / Self Care | Payer: Managed Care, Other (non HMO) | Attending: Emergency Medicine | Admitting: Emergency Medicine

## 2017-03-30 ENCOUNTER — Emergency Department (HOSPITAL_COMMUNITY): Payer: Managed Care, Other (non HMO)

## 2017-03-30 ENCOUNTER — Encounter (HOSPITAL_COMMUNITY): Payer: Self-pay | Admitting: *Deleted

## 2017-03-30 DIAGNOSIS — M25512 Pain in left shoulder: Secondary | ICD-10-CM | POA: Diagnosis present

## 2017-03-30 DIAGNOSIS — D169 Benign neoplasm of bone and articular cartilage, unspecified: Secondary | ICD-10-CM

## 2017-03-30 DIAGNOSIS — M25552 Pain in left hip: Secondary | ICD-10-CM | POA: Diagnosis not present

## 2017-03-30 DIAGNOSIS — M778 Other enthesopathies, not elsewhere classified: Secondary | ICD-10-CM

## 2017-03-30 DIAGNOSIS — M7582 Other shoulder lesions, left shoulder: Secondary | ICD-10-CM | POA: Diagnosis not present

## 2017-03-30 MED ORDER — IBUPROFEN 800 MG PO TABS: 800.0000 mg | ORAL_TABLET | Freq: Three times a day (TID) | ORAL | 0 refills | Status: DC

## 2017-03-30 MED ORDER — METHOCARBAMOL 500 MG PO TABS: 500.0000 mg | ORAL_TABLET | Freq: Two times a day (BID) | ORAL | 0 refills | Status: DC

## 2017-03-30 NOTE — ED Provider Notes (Signed)
[place] DEPT Provider Note   CSN: [id number] Arrival date & time: 03/30/17  1601     History   Chief Complaint Chief Complaint  Patient presents with  . Shoulder Pain  . Hip Pain    HPI Shawn Howard is a 31 y.o. male.  The history is provided by the patient. No language interpreter was used.  Hip Pain  This is a new problem. The current episode started more than 2 days ago (2). The problem occurs constantly. The problem has been gradually worsening. Nothing aggravates the symptoms. Nothing relieves the symptoms. He has tried nothing for the symptoms. The treatment provided no relief.  Pt reports he has pain in his left hip and his left shoulder.  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  Pt reports repetetive motion  History reviewed. No pertinent past medical history.  There are no active problems to display for this patient.   History reviewed. No pertinent surgical history.     Home Medications    Prior to Admission medications   Not on File    Family History No family history on file.  Social History Social History  Substance Use Topics  . Smoking status: Never Smoker  . Smokeless tobacco: Never Used  . Alcohol use No     Allergies   Patient has no known allergies.   Review of Systems Review of Systems  All other systems reviewed and are negative.    Physical Exam Updated Vital Signs BP (!) 124/93   Pulse 62   Temp 98.1 F (36.7 C) (Oral)   Resp 18   SpO2 100%   Physical Exam  Constitutional: He appears well-developed and well-nourished.  HENT:  Head: Normocephalic.  Musculoskeletal: He exhibits tenderness.  Tender left shoulder, pain with range of motion,  nv and ns intact. Tender left hip,  Pain with movement. nv and ns intact  Neurological: He is alert.  Skin: Skin is warm.  Psychiatric: He has a normal mood and affect.  Nursing note and vitals reviewed.    ED Treatments / Results  Labs (all labs ordered are  listed, but only abnormal results are displayed) Labs Reviewed - No data to display  EKG  EKG Interpretation None       Radiology Ct Hip Left Wo Contrast  Result Date: 03/30/2017 CLINICAL DATA:  Left hip pain for 2 days.  No known injury. EXAM: CT OF THE LEFT HIP WITHOUT CONTRAST TECHNIQUE: Multidetector CT imaging of the left hip was performed according to the standard protocol. Multiplanar CT image reconstructions were also generated. COMPARISON:  None. FINDINGS: Bones/Joint/Cartilage No fracture or dislocation. Normal alignment. No joint effusion. Cortically based bone lesion with a thick sclerotic rim and central lucency involving the posterior intertrochanteric aspect of the left proximal femur. No significant periosteal reaction. No other lytic or sclerotic osseous lesion. Ligaments Ligaments are suboptimally evaluated by CT. Muscles and Tendons Muscles are normal. No muscle atrophy. No intramuscular fluid collection or hematoma. Soft tissue No fluid collection or hematoma.  No soft tissue mass. IMPRESSION: 1. Cortically based bone lesion with a thick sclerotic rim and central lucency involving the posterior intertrochanteric aspect of the left proximal femur. No significant periosteal reaction. Differential diagnosis includes osteoid osteoma versus less likely Brodie's abscess. Electronically Signed   By: Kathreen Devoid   On: 03/30/2017 08:50   Dg Shoulder Left  Result Date: 03/30/2017 CLINICAL DATA:  Shoulder pain over the last year, worse in the mornings. EXAM:  LEFT SHOULDER - 2+ VIEW COMPARISON:  None. FINDINGS: Glenohumeral joint is normal. Humeral acromial distance is within normal limits. AC joint is normal. IMPRESSION: No abnormal radiographic finding. Electronically Signed   By: Nelson Chimes M.D.   On: 03/30/2017 07:11   Dg Hip Unilat W Or Wo Pelvis 2-3 Views Left  Result Date: 03/30/2017 CLINICAL DATA:  Left hip and back pain over the last 2 days. EXAM: DG HIP (WITH OR WITHOUT  PELVIS) 2-3V LEFT COMPARISON:  None. FINDINGS: Bones of the pelvis are normal. No degenerative changes of either hip, either sacroiliac joint or the symphysis pubis. There is a sclerotic focus in the intertrochanteric region of the left femur with a central lucency. Whereas this is most likely a benign bone island, the presence of left hip pain and a central lucency do raise the possibility of osteoid osteoma. Either CT or MRI could be used to investigate this further. IMPRESSION: No acute or traumatic finding. 1 cm sclerotic focus with central lucency in the intertrochanteric region of the left femur. Whereas this is most likely an insignificant bone island, osteoid osteoma is not excluded. Given the presence of pain, this could be evaluated further with either CT or MR. Electronically Signed   By: Nelson Chimes M.D.   On: 03/30/2017 07:10    Procedures Procedures (including critical care time)  Medications Ordered in ED Medications - No data to display   Initial Impression / Assessment and Plan / ED Course  I have reviewed the triage vital signs and the nursing notes.  Pertinent labs & imaging results that were available during my care of the patient were reviewed by me and considered in my medical decision making (see chart for details).     Xray shows a 1cm sclerotic focus, radiologist advised ct scan.  Ct reviewed,  Possible osteoma vs brodies abscess.   I doubt abscess.  I will refer pt to Orthopaedist for evaluation.  I will treat with ibuprofen and robaxin  Final Clinical Impressions(s) / ED Diagnoses   Final diagnoses:  Shoulder tendonitis, left  Osteoid osteoma  Left hip pain    New Prescriptions New Prescriptions   IBUPROFEN (ADVIL,MOTRIN) 800 MG TABLET    Take 1 tablet (800 mg total) by mouth 3 (three) times daily.   METHOCARBAMOL (ROBAXIN) 500 MG TABLET    Take 1 tablet (500 mg total) by mouth 2 (two) times daily.     Fransico Meadow, PA-C 03/30/17 [place]    Noemi Chapel, MD 04/01/17 2019

## 2017-03-30 NOTE — ED Notes (Signed)
Patient transported to X-ray 

## 2017-03-30 NOTE — ED Triage Notes (Signed)
Pt c/o L shoulder pain for the past year, worse this morning when waking up. Pt also reports L hip/back pain for the past 2 days

## 2017-03-30 NOTE — ED Notes (Signed)
Knee immobilizer removed due to misunderstanding of orders. Patient ambulatory without difficulty.

## 2017-03-30 NOTE — ED Notes (Signed)
Pt ambulatory at departure. Voices understanding of importance of ortho follow up. NAD.

## 2017-03-30 NOTE — ED Notes (Addendum)
Knee immobilizer placed on pt per Holley(RN)

## 2017-03-31 ENCOUNTER — Encounter: Payer: Self-pay | Admitting: Urgent Care

## 2018-01-05 ENCOUNTER — Ambulatory Visit (INDEPENDENT_AMBULATORY_CARE_PROVIDER_SITE_OTHER): Payer: Managed Care, Other (non HMO) | Admitting: Urgent Care

## 2018-01-05 ENCOUNTER — Other Ambulatory Visit: Payer: Self-pay

## 2018-01-05 ENCOUNTER — Encounter: Payer: Self-pay | Admitting: Urgent Care

## 2018-01-05 VITALS — BP 122/70 | HR 87 | Temp 98.9°F | Resp 16 | Ht 68.0 in | Wt 160.0 lb

## 2018-01-05 DIAGNOSIS — Z1329 Encounter for screening for other suspected endocrine disorder: Secondary | ICD-10-CM | POA: Diagnosis not present

## 2018-01-05 DIAGNOSIS — Z130 Encounter for screening for diseases of the blood and blood-forming organs and certain disorders involving the immune mechanism: Secondary | ICD-10-CM | POA: Diagnosis not present

## 2018-01-05 DIAGNOSIS — Z0000 Encounter for general adult medical examination without abnormal findings: Secondary | ICD-10-CM | POA: Diagnosis not present

## 2018-01-05 DIAGNOSIS — Z1321 Encounter for screening for nutritional disorder: Secondary | ICD-10-CM | POA: Diagnosis not present

## 2018-01-05 DIAGNOSIS — Z13228 Encounter for screening for other metabolic disorders: Secondary | ICD-10-CM

## 2018-01-05 DIAGNOSIS — R4582 Worries: Secondary | ICD-10-CM

## 2018-01-05 NOTE — Progress Notes (Signed)
MRN: [id number]  Subjective:   [name] is a 32 y.o. male presenting for annual physical exam. Patient is married, has 4 children. Drives for work, has [employer]. Has good relationships at home, has a good support network. Denies smoking cigarettes or drinking alcohol.   Medical care team includes: PCP: Patient, No Pcp Per Vision: No visual deficits. Dental: Cleanings every 6 months - 1 year. Specialists: Has a benign tumor of left hip, has osteoid osteoma. Is being monitored by orthopedist. Health Maintenance: Tdap on 09/02/2014.  Shawn Howard is not currently taking any medications. He has No Known Allergies. Shawn Howard  has a past medical history of Anxiety, Osteoid osteoma of pelvis, Pyloric stenosis, and Shingles. Also  has a past surgical history that includes Abdominal surgery. His family history includes Diabetes in his mother; Hypertension in his father.  Review of Systems  Constitutional: Negative for chills, diaphoresis, fever, malaise/fatigue and weight loss.  HENT: Negative for congestion, ear discharge, ear pain, hearing loss, nosebleeds, sore throat and tinnitus.   Eyes: Negative for blurred vision, double vision, photophobia, pain, discharge and redness.  Respiratory: Negative for cough, shortness of breath and wheezing.   Cardiovascular: Negative for chest pain, palpitations and leg swelling.  Gastrointestinal: Negative for abdominal pain, blood in stool, constipation, diarrhea, nausea and vomiting.  Genitourinary: Negative for dysuria, flank pain, frequency, hematuria and urgency.  Musculoskeletal: Negative for back pain, joint pain and myalgias.  Skin: Negative for itching and rash.  Neurological: Negative for dizziness, tingling, seizures, loss of consciousness, weakness and headaches.  Endo/Heme/Allergies: Negative for polydipsia.  Psychiatric/Behavioral: Negative for depression, hallucinations, memory loss, substance abuse and suicidal ideas. The patient is not  nervous/anxious and does not have insomnia.    Objective:   Vitals: BP (!) 147/87   Pulse 87   Temp 98.9 F (37.2 C) (Oral)   Resp 16   Ht 5\' 8"  (1.727 m)   Wt 160 lb (72.6 kg)   SpO2 99%   BMI 24.33 kg/m   BP Readings from Last 3 Encounters:  01/05/18 (!) 147/87  03/30/17 113/75  12/30/16 126/85    Visual Acuity Screening   Right eye Left eye Both eyes  Without correction: 20/15-2 20/15-1 20/13-1  With correction:       Physical Exam  Constitutional: He is oriented to person, place, and time. He appears well-developed and well-nourished.  HENT:  TM's intact bilaterally, no effusions or erythema. Nasal turbinates pink and moist, nasal passages patent. No sinus tenderness. Oropharynx clear, mucous membranes moist, dentition in good repair.  Eyes: Pupils are equal, round, and reactive to light. Conjunctivae and EOM are normal. Right eye exhibits no discharge. Left eye exhibits no discharge. No scleral icterus.  Neck: Normal range of motion. Neck supple. No thyromegaly present.  Cardiovascular: Normal rate, regular rhythm and intact distal pulses. Exam reveals no gallop and no friction rub.  No murmur heard. Pulmonary/Chest: No stridor. No respiratory distress. He has no wheezes. He has no rales.  Abdominal: Soft. Bowel sounds are normal. He exhibits no distension and no mass. There is no tenderness.  Musculoskeletal: Normal range of motion. He exhibits no edema or tenderness.  Lymphadenopathy:    He has no cervical adenopathy.  Neurological: He is alert and oriented to person, place, and time. He has normal reflexes.  Skin: Skin is warm and dry. No rash noted. No erythema. No pallor.  Psychiatric: He has a normal mood and affect.   Assessment and Plan :  Annual physical exam  Screening for endocrine, nutritional, metabolic and immunity disorder - Plan: Comprehensive metabolic panel, Lipid panel, TSH  Screening for deficiency anemia - Plan: CBC  Worries - Plan: HIV  antibody, RPR, Trichomonas vaginalis, RNA, GC/Chlamydia Probe Amp([facility])  Patient is medically stable, very pleasant person. Discussed healthy lifestyle, diet, exercise, preventative care, vaccinations, and addressed patient's concerns.   Jaynee Eagles, PA-C [facility] [phone] 01/05/2018  11:09 AM

## 2018-01-05 NOTE — Patient Instructions (Addendum)
Health Maintenance, Male A healthy lifestyle and preventive care is important for your health and wellness. Ask your health care provider about what schedule of regular examinations is right for you. What should I know about weight and diet? Eat a Healthy Diet  Eat plenty of vegetables, fruits, whole grains, low-fat dairy products, and lean protein.  Do not eat a lot of foods high in solid fats, added sugars, or salt.  Maintain a Healthy Weight Regular exercise can help you achieve or maintain a healthy weight. You should:  Do at least 150 minutes of exercise each week. The exercise should increase your heart rate and make you sweat (moderate-intensity exercise).  Do strength-training exercises at least twice a week.  Watch Your Levels of Cholesterol and Blood Lipids  Have your blood tested for lipids and cholesterol every 5 years starting at 32 years of age. If you are at high risk for heart disease, you should start having your blood tested when you are 32 years old. You may need to have your cholesterol levels checked more often if: ? Your lipid or cholesterol levels are high. ? You are older than 32 years of age. ? You are at high risk for heart disease.  What should I know about cancer screening? Many types of cancers can be detected early and may often be prevented. Lung Cancer  You should be screened every year for lung cancer if: ? You are a current smoker who has smoked for at least 30 years. ? You are a former smoker who has quit within the past 15 years.  Talk to your health care provider about your screening options, when you should start screening, and how often you should be screened.  Colorectal Cancer  Routine colorectal cancer screening usually begins at 32 years of age and should be repeated every 5-10 years until you are 32 years old. You may need to be screened more often if early forms of precancerous polyps or small growths are found. Your health care provider  may recommend screening at an earlier age if you have risk factors for colon cancer.  Your health care provider may recommend using home test kits to check for hidden blood in the stool.  A small camera at the end of a tube can be used to examine your colon (sigmoidoscopy or colonoscopy). This checks for the earliest forms of colorectal cancer.  Prostate and Testicular Cancer  Depending on your age and overall health, your health care provider may do certain tests to screen for prostate and testicular cancer.  Talk to your health care provider about any symptoms or concerns you have about testicular or prostate cancer.  Skin Cancer  Check your skin from head to toe regularly.  Tell your health care provider about any new moles or changes in moles, especially if: ? There is a change in a mole's size, shape, or color. ? You have a mole that is larger than a pencil eraser.  Always use sunscreen. Apply sunscreen liberally and repeat throughout the day.  Protect yourself by wearing long sleeves, pants, a wide-brimmed hat, and sunglasses when outside.  What should I know about heart disease, diabetes, and high blood pressure?  If you are 18-39 years of age, have your blood pressure checked every 3-5 years. If you are 40 years of age or older, have your blood pressure checked every year. You should have your blood pressure measured twice-once when you are at a hospital or clinic, and once when   you are not at a hospital or clinic. Record the average of the two measurements. To check your blood pressure when you are not at a hospital or clinic, you can use: ? An automated blood pressure machine at a pharmacy. ? A home blood pressure monitor.  Talk to your health care provider about your target blood pressure.  If you are between 45-79 years old, ask your health care provider if you should take aspirin to prevent heart disease.  Have regular diabetes screenings by checking your fasting blood  sugar level. ? If you are at a normal weight and have a low risk for diabetes, have this test once every three years after the age of 45. ? If you are overweight and have a high risk for diabetes, consider being tested at a younger age or more often.  A one-time screening for abdominal aortic aneurysm (AAA) by ultrasound is recommended for men aged 65-75 years who are current or former smokers. What should I know about preventing infection? Hepatitis B If you have a higher risk for hepatitis B, you should be screened for this virus. Talk with your health care provider to find out if you are at risk for hepatitis B infection. Hepatitis C Blood testing is recommended for:  Everyone born from 1945 through 1965.  Anyone with known risk factors for hepatitis C.  Sexually Transmitted Diseases (STDs)  You should be screened each year for STDs including gonorrhea and chlamydia if: ? You are sexually active and are younger than 32 years of age. ? You are older than 32 years of age and your health care provider tells you that you are at risk for this type of infection. ? Your sexual activity has changed since you were last screened and you are at an increased risk for chlamydia or gonorrhea. Ask your health care provider if you are at risk.  Talk with your health care provider about whether you are at high risk of being infected with HIV. Your health care provider may recommend a prescription medicine to help prevent HIV infection.  What else can I do?  Schedule regular health, dental, and eye exams.  Stay current with your vaccines (immunizations).  Do not use any tobacco products, such as cigarettes, chewing tobacco, and e-cigarettes. If you need help quitting, ask your health care provider.  Limit alcohol intake to no more than 2 drinks per day. One drink equals 12 ounces of beer, 5 ounces of wine, or 1 ounces of hard liquor.  Do not use street drugs.  Do not share needles.  Ask your  health care provider for help if you need support or information about quitting drugs.  Tell your health care provider if you often feel depressed.  Tell your health care provider if you have ever been abused or do not feel safe at home. This information is not intended to replace advice given to you by your health care provider. Make sure you discuss any questions you have with your health care provider. Document Released: 12/07/2007 Document Revised: 02/07/2016 Document Reviewed: 03/14/2015 Elsevier Interactive Patient Education  2018 Elsevier Inc.     IF you received an x-ray today, you will receive an invoice from [facility]. Please contact [facility] at [phone] with questions or concerns regarding your invoice.   IF you received labwork today, you will receive an invoice from [facility]. Please contact [facility] at [phone] with questions or concerns regarding your invoice.   Our billing staff will not be   able to assist you with questions regarding bills from these companies.  You will be contacted with the lab results as soon as they are available. The fastest way to get your results is to activate your My Chart account. Instructions are located on the last page of this paperwork. If you have not heard from us regarding the results in 2 weeks, please contact this office.       

## 2018-01-06 LAB — COMPREHENSIVE METABOLIC PANEL
ALT: 23 IU/L (ref 0–44)
AST: 15 IU/L (ref 0–40)
Albumin/Globulin Ratio: 1.2 (ref 1.2–2.2)
Albumin: 4.2 g/dL (ref 3.5–5.5)
Alkaline Phosphatase: 44 IU/L (ref 39–117)
BUN/Creatinine Ratio: 11 (ref 9–20)
BUN: 10 mg/dL (ref 6–20)
Bilirubin Total: 0.6 mg/dL (ref 0.0–1.2)
CO2: 25 mmol/L (ref 20–29)
Calcium: 9.5 mg/dL (ref 8.7–10.2)
Chloride: 104 mmol/L (ref 96–106)
Creatinine, Ser: 0.95 mg/dL (ref 0.76–1.27)
GFR calc Af Amer: 123 mL/min/{1.73_m2} (ref >59)
GFR calc non Af Amer: 106 mL/min/{1.73_m2} (ref >59)
Globulin, Total: 3.5 g/dL (ref 1.5–4.5)
Glucose: 102 mg/dL — ABNORMAL HIGH (ref 65–99)
Potassium: 3.8 mmol/L (ref 3.5–5.2)
Sodium: 140 mmol/L (ref 134–144)
Total Protein: 7.7 g/dL (ref 6.0–8.5)

## 2018-01-06 LAB — TRICHOMONAS VAGINALIS, PROBE AMP: Trich vag by NAA: NEGATIVE

## 2018-01-06 LAB — CBC
Hematocrit: 42.1 % (ref 37.5–51.0)
Hemoglobin: 13.7 g/dL (ref 13.0–17.7)
MCH: 29.7 pg (ref 26.6–33.0)
MCHC: 32.5 g/dL (ref 31.5–35.7)
MCV: 91 fL (ref 79–97)
Platelets: 271 10*3/uL (ref 150–450)
RBC: 4.62 x10E6/uL (ref 4.14–5.80)
RDW: 14.8 % (ref 12.3–15.4)
WBC: 4.5 10*3/uL (ref 3.4–10.8)

## 2018-01-06 LAB — LIPID PANEL
Chol/HDL Ratio: 1.8 ratio (ref 0.0–5.0)
Cholesterol, Total: 120 mg/dL (ref 100–199)
HDL: 67 mg/dL (ref >39)
LDL Calculated: 47 mg/dL (ref 0–99)
Triglycerides: 28 mg/dL (ref 0–149)
VLDL Cholesterol Cal: 6 mg/dL (ref 5–40)

## 2018-01-06 LAB — RPR: RPR Ser Ql: NONREACTIVE

## 2018-01-06 LAB — HIV ANTIBODY (ROUTINE TESTING W REFLEX): HIV Screen 4th Generation wRfx: NONREACTIVE

## 2018-01-06 LAB — TSH: TSH: 1.46 u[IU]/mL (ref 0.450–4.500)

## 2018-01-06 LAB — GC/CHLAMYDIA PROBE AMP
Chlamydia trachomatis, NAA: NEGATIVE
Neisseria gonorrhoeae by PCR: NEGATIVE

## 2018-06-19 ENCOUNTER — Encounter (HOSPITAL_COMMUNITY): Payer: Self-pay | Admitting: Emergency Medicine

## 2018-06-19 ENCOUNTER — Ambulatory Visit (HOSPITAL_COMMUNITY)
Admission: EM | Admit: 2018-06-19 | Discharge: 2018-06-19 | Disposition: A | Discharge status: Home / Self Care | Payer: Managed Care, Other (non HMO) | Attending: Family Medicine | Admitting: Family Medicine

## 2018-06-19 DIAGNOSIS — K120 Recurrent oral aphthae: Secondary | ICD-10-CM | POA: Diagnosis not present

## 2018-06-19 MED ORDER — TRIAMCINOLONE ACETONIDE 0.1 % MT PSTE: 1.0000 "application " | PASTE | Freq: Two times a day (BID) | OROMUCOSAL | 12 refills | Status: DC

## 2018-06-19 NOTE — Discharge Instructions (Signed)
Continue your good oral hygiene Use the orabase 2 - 3 x a day Go to dentist if not improved in a week

## 2018-06-19 NOTE — ED Triage Notes (Signed)
Pt sts sore in mouth x 2 weeks that is painful

## 2018-06-19 NOTE — ED Provider Notes (Signed)
[place] [facility]    CSN: [id number] Arrival date & time: 06/19/18  0909     History   Chief Complaint Chief Complaint  Patient presents with  . Mouth Lesions    HPI Shawn Howard is a 32 y.o. male.   HPI  Patient is had a sore inside his mouth for about 2 weeks.  Is present in the back of his right molar region.  No change in size.  No drainage.  He states he does not chew tobacco.  No tobacco products.  He is never had this before.  No fever or cold symptoms.  Past Medical History:  Diagnosis Date  . Anxiety   . Osteoid osteoma of pelvis   . Pyloric stenosis   . Shingles     There are no active problems to display for this patient.   Past Surgical History:  Procedure Laterality Date  . ABDOMINAL SURGERY     pyloric stenosis       Home Medications    Prior to Admission medications   Medication Sig Start Date End Date Taking? Authorizing Provider  triamcinolone (KENALOG) 0.1 % paste Use as directed 1 application in the mouth or throat 2 (two) times daily. 06/19/18   Raylene Everts, MD    Family History Family History  Problem Relation Age of Onset  . Diabetes Mother   . Hypertension Father     Social History Social History   Tobacco Use  . Smoking status: Never Smoker  . Smokeless tobacco: Never Used  Substance Use Topics  . Alcohol use: No  . Drug use: No     Allergies   Patient has no known allergies.   Review of Systems Review of Systems  Constitutional: Negative for chills and fever.  HENT: Positive for mouth sores. Negative for ear pain and sore throat.   Eyes: Negative for pain and visual disturbance.  Respiratory: Negative for cough and shortness of breath.   Cardiovascular: Negative for chest pain and palpitations.  Gastrointestinal: Negative for abdominal pain and vomiting.  Genitourinary: Negative for dysuria and hematuria.  Musculoskeletal: Negative for arthralgias and back pain.  Skin: Negative for color change  and rash.  Neurological: Negative for seizures and syncope.  All other systems reviewed and are negative.    Physical Exam Triage Vital Signs ED Triage Vitals [06/19/18 1031]  Enc Vitals Group     BP 137/79     Pulse Rate 70     Resp 18     Temp 98 F (36.7 C)     Temp Source Oral     SpO2 100 %     Weight      Height      Head Circumference      Peak Flow      Pain Score 3     Pain Loc      Pain Edu?      Excl. in [place]?    No data found.  Updated Vital Signs BP 137/79 (BP Location: Right Arm)   Pulse 70   Temp 98 F (36.7 C) (Oral)   Resp 18   SpO2 100%   Visual Acuity Right Eye Distance:   Left Eye Distance:   Bilateral Distance:    Right Eye Near:   Left Eye Near:    Bilateral Near:     Physical Exam Constitutional:      General: He is not in acute distress.    Appearance: He is well-developed.  HENT:     Head: Normocephalic and atraumatic.     Right Ear: Tympanic membrane and ear canal normal.     Left Ear: Tympanic membrane and ear canal normal.     Nose: No congestion or rhinorrhea.     Mouth/Throat:     Mouth: Mucous membranes are moist.   Eyes:     Conjunctiva/sclera: Conjunctivae normal.     Pupils: Pupils are equal, round, and reactive to light.  Neck:     Musculoskeletal: Normal range of motion.  Cardiovascular:     Rate and Rhythm: Normal rate.  Pulmonary:     Effort: Pulmonary effort is normal. No respiratory distress.  Abdominal:     General: There is no distension.     Palpations: Abdomen is soft.  Musculoskeletal: Normal range of motion.  Skin:    General: Skin is warm and dry.  Neurological:     Mental Status: He is alert.      UC Treatments / Results  Labs (all labs ordered are listed, but only abnormal results are displayed) Labs Reviewed - No data to display  EKG None  Radiology No results found.  Procedures Procedures (including critical care time)  Medications Ordered in UC Medications - No data to  display  Initial Impression / Assessment and Plan / UC Course  I have reviewed the triage vital signs and the nursing notes.  Pertinent labs & imaging results that were available during my care of the patient were reviewed by me and considered in my medical decision making (see chart for details).     Patient is worried about an oral cancer.  This does not look like a cancerous lesion.  It really does not look entirely like an aphthous ulcer either.  I am to give him some Kenalog and Orabase and tell him to keep an eye on it.  He needs to see a dentist if persists. Final Clinical Impressions(s) / UC Diagnoses   Final diagnoses:  Aphthous ulcer of mouth     Discharge Instructions     Continue your good oral hygiene Use the orabase 2 - 3 x a day Go to dentist if not improved in a week   ED Prescriptions    Medication Sig Dispense Auth. Provider   triamcinolone (KENALOG) 0.1 % paste Use as directed 1 application in the mouth or throat 2 (two) times daily. 5 g Raylene Everts, MD     Controlled Substance Prescriptions [place] Controlled Substance Registry consulted? Not Applicable   Raylene Everts, MD 06/19/18 2133

## 2018-06-21 ENCOUNTER — Emergency Department (HOSPITAL_BASED_OUTPATIENT_CLINIC_OR_DEPARTMENT_OTHER)
Admission: EM | Admit: 2018-06-21 | Discharge: 2018-06-22 | Disposition: A | Discharge status: Home / Self Care | Payer: Managed Care, Other (non HMO) | Attending: Emergency Medicine | Admitting: Emergency Medicine

## 2018-06-21 ENCOUNTER — Other Ambulatory Visit: Payer: Self-pay

## 2018-06-21 ENCOUNTER — Encounter (HOSPITAL_BASED_OUTPATIENT_CLINIC_OR_DEPARTMENT_OTHER): Payer: Self-pay | Admitting: Emergency Medicine

## 2018-06-21 DIAGNOSIS — Z79899 Other long term (current) drug therapy: Secondary | ICD-10-CM | POA: Insufficient documentation

## 2018-06-21 DIAGNOSIS — R51 Headache: Secondary | ICD-10-CM | POA: Diagnosis not present

## 2018-06-21 DIAGNOSIS — R519 Headache, unspecified: Secondary | ICD-10-CM

## 2018-06-21 NOTE — ED Triage Notes (Signed)
Pt c/o 8/10 Headache for the past 4 days not getting better, with some nausea no vomiting, no visual changes.

## 2018-06-22 NOTE — Discharge Instructions (Addendum)
Ibuprofen 600 mg every 6 hours as needed for pain.  Return to the emergency department for worsening headache, blurry vision, numbness or tingling, or other new and concerning symptoms.

## 2018-06-22 NOTE — ED Notes (Signed)
ED Provider at bedside. 

## 2018-06-22 NOTE — ED Provider Notes (Signed)
[facility] Provider Note   CSN: [id number] Arrival date & time: 06/21/18  2301     History   Chief Complaint Chief Complaint  Patient presents with  . Headache    HPI [name] is a 32 y.o. male.  Patient is a 32 year old male with history of anxiety.  He presents today with complaints of headache.  This is been ongoing for the past several days.  He took ibuprofen which relieved it for a short period of time, then returned, he has not taken another dose of ibuprofen or other medication since.  He denies any visual disturbances.  He denies any injury or trauma.  He denies any numbness or tingling.  The history is provided by the patient.  Headache   This is a new problem. Episode onset: 3 days ago. The problem occurs constantly. The problem has not changed since onset.The headache is associated with nothing. The pain is located in the frontal region. The quality of the pain is described as dull. The pain is moderate. The pain does not radiate. Pertinent negatives include no fever.    Past Medical History:  Diagnosis Date  . Anxiety   . Osteoid osteoma of pelvis   . Pyloric stenosis   . Shingles     There are no active problems to display for this patient.   Past Surgical History:  Procedure Laterality Date  . ABDOMINAL SURGERY     pyloric stenosis        Home Medications    Prior to Admission medications   Medication Sig Start Date End Date Taking? Authorizing Provider  triamcinolone (KENALOG) 0.1 % paste Use as directed 1 application in the mouth or throat 2 (two) times daily. 06/19/18   Raylene Everts, MD    Family History Family History  Problem Relation Age of Onset  . Diabetes Mother   . Hypertension Father     Social History Social History   Tobacco Use  . Smoking status: Never Smoker  . Smokeless tobacco: Never Used  Substance Use Topics  . Alcohol use: No  . Drug use: No     Allergies   Patient  has no known allergies.   Review of Systems Review of Systems  Constitutional: Negative for fever.  Neurological: Positive for headaches.  All other systems reviewed and are negative.    Physical Exam Updated Vital Signs BP 138/78 (BP Location: Right Arm)   Pulse 80   Temp 99.1 F (37.3 C) (Oral)   Resp 16   Ht 5\' 8"  (1.727 m)   Wt 70.3 kg   SpO2 100%   BMI 23.57 kg/m   Physical Exam Vitals signs and nursing note reviewed.  Constitutional:      General: He is not in acute distress.    Appearance: He is well-developed. He is not diaphoretic.  HENT:     Head: Normocephalic and atraumatic.  Eyes:     General: No visual field deficit.    Extraocular Movements: Extraocular movements intact.     Right eye: Normal extraocular motion and no nystagmus.     Left eye: Normal extraocular motion and no nystagmus.     Pupils: Pupils are equal, round, and reactive to light.  Neck:     Musculoskeletal: Normal range of motion and neck supple. No neck rigidity.  Cardiovascular:     Rate and Rhythm: Normal rate and regular rhythm.     Heart sounds: No murmur. No friction rub.  Pulmonary:     Effort: Pulmonary effort is normal. No respiratory distress.     Breath sounds: Normal breath sounds. No wheezing or rales.  Abdominal:     General: Bowel sounds are normal. There is no distension.     Palpations: Abdomen is soft.     Tenderness: There is no abdominal tenderness.  Musculoskeletal: Normal range of motion.  Skin:    General: Skin is warm and dry.  Neurological:     Mental Status: He is alert and oriented to person, place, and time.     Cranial Nerves: No cranial nerve deficit or facial asymmetry.     Sensory: No sensory deficit.     Coordination: Coordination normal.      ED Treatments / Results  Labs (all labs ordered are listed, but only abnormal results are displayed) Labs Reviewed - No data to display  EKG None  Radiology No results  found.  Procedures Procedures (including critical care time)  Medications Ordered in ED Medications - No data to display   Initial Impression / Assessment and Plan / ED Course  I have reviewed the triage vital signs and the nursing notes.  Pertinent labs & imaging results that were available during my care of the patient were reviewed by me and considered in my medical decision making (see chart for details).  Patient presents with complaints of headache.  He is neurologically intact and appears in no distress.  Options were discussed with the patient including medications and watch/wait approach and the possibility of a CT scan.  We have decided to forego the CT scan for now.  He will try the medications and if not improving will follow-up with primary doctor and return if symptoms worsen.  Final Clinical Impressions(s) / ED Diagnoses   Final diagnoses:  Acute nonintractable headache, unspecified headache type    ED Discharge Orders    None       Veryl Speak, MD 06/22/18 0236

## 2019-06-04 ENCOUNTER — Ambulatory Visit
Admission: EM | Admit: 2019-06-04 | Discharge: 2019-06-04 | Disposition: A | Discharge status: Home / Self Care | Payer: BC Managed Care – PPO

## 2019-06-04 ENCOUNTER — Encounter: Payer: Self-pay | Admitting: Emergency Medicine

## 2019-06-04 ENCOUNTER — Other Ambulatory Visit: Payer: Self-pay

## 2019-06-04 DIAGNOSIS — M545 Low back pain, unspecified: Secondary | ICD-10-CM

## 2019-06-04 DIAGNOSIS — H5310 Unspecified subjective visual disturbances: Secondary | ICD-10-CM

## 2019-06-04 DIAGNOSIS — N489 Disorder of penis, unspecified: Secondary | ICD-10-CM | POA: Diagnosis not present

## 2019-06-04 NOTE — ED Provider Notes (Signed)
[facility]    CSN: [id number] Arrival date & time: 06/04/19  1245      History   Chief Complaint Chief Complaint  Patient presents with  . Back Pain    HPI DAYSHON BEEKMAN is a 33 y.o. male.   33 year old male comes in for multiple complaints.   1. 2 day history of eye fatigue, heaviness. States happened "after eating pizza and laying down". Denies cough, sore throat, rhinorrhea, nasal congestion. Some frontal pressure. Denies vision changes, blurry vision, photophobia. Denies conjunctival injection, eye discharge. Does have glasses, but does not wear often. Denies sick/covid contact.   2. Few day history of low back pain bilaterally. Denies any injury/trauma. Pain worse with movement. Denies radiation of pain. Denies saddle anesthesia, loss of bladder or bowel control.  3. Few day history of penile wound/ulcer. Denies penile discharge, testicular swelling, testicular pain. Denies urinary symptoms such as frequency, dysuria, hematuria. Denies history of HSV. Sexually active with one male partner, no condom use.     Past Medical History:  Diagnosis Date  . Anxiety   . Osteoid osteoma of pelvis   . Pyloric stenosis   . Shingles     There are no problems to display for this patient.   Past Surgical History:  Procedure Laterality Date  . ABDOMINAL SURGERY     pyloric stenosis       Home Medications    Prior to Admission medications   Not on File    Family History Family History  Problem Relation Age of Onset  . Diabetes Mother   . Hypertension Father     Social History Social History   Tobacco Use  . Smoking status: Never Smoker  . Smokeless tobacco: Never Used  Substance Use Topics  . Alcohol use: No  . Drug use: No     Allergies   Patient has no known allergies.   Review of Systems Review of Systems  Reason unable to perform ROS: See HPI as above.     Physical Exam Triage Vital Signs ED Triage Vitals [06/04/19 1259]    Enc Vitals Group     BP (!) 143/93     Pulse Rate 86     Resp 18     Temp 97.9 F (36.6 C)     Temp Source Temporal     SpO2 98 %     Weight      Height      Head Circumference      Peak Flow      Pain Score      Pain Loc      Pain Edu?      Excl. in [place]?    No data found.  Updated Vital Signs BP (!) 143/93 (BP Location: Left Arm)   Pulse 86   Temp 97.9 F (36.6 C) (Temporal)   Resp 18   SpO2 98%   Physical Exam Constitutional:      General: He is not in acute distress.    Appearance: Normal appearance. He is not ill-appearing, toxic-appearing or diaphoretic.  HENT:     Head: Normocephalic and atraumatic.     Nose:     Right Sinus: No maxillary sinus tenderness or frontal sinus tenderness.     Left Sinus: No maxillary sinus tenderness or frontal sinus tenderness.     Mouth/Throat:     Mouth: Mucous membranes are moist.     Pharynx: Oropharynx is clear. Uvula midline.  Eyes:  General: Lids are normal.     Extraocular Movements: Extraocular movements intact.     Conjunctiva/sclera: Conjunctivae normal.     Pupils: Pupils are equal, round, and reactive to light.  Cardiovascular:     Rate and Rhythm: Normal rate and regular rhythm.     Heart sounds: Normal heart sounds. No murmur. No friction rub. No gallop.   Pulmonary:     Effort: Pulmonary effort is normal. No accessory muscle usage, prolonged expiration, respiratory distress or retractions.     Comments: Lungs clear to auscultation without adventitious lung sounds. Genitourinary:    Comments: Deferred per patient Musculoskeletal:     Cervical back: Normal range of motion and neck supple.     Comments: No tenderness to palpation of spinous processes. Tenderness to palpation of lumbar region. Negative straight leg raise.   Skin:    General: Skin is warm and dry.  Neurological:     General: No focal deficit present.     Mental Status: He is alert and oriented to person, place, and time.    UC Treatments  / Results  Labs (all labs ordered are listed, but only abnormal results are displayed) Labs Reviewed - No data to display  EKG   Radiology No results found.  Procedures Procedures (including critical care time)  Medications Ordered in UC Medications - No data to display  Initial Impression / Assessment and Plan / UC Course  I have reviewed the triage vital signs and the nursing notes.  Pertinent labs & imaging results that were available during my care of the patient were reviewed by me and considered in my medical decision making (see chart for details).    Patient came in with multiple complaints, including acute and chronic conditions. After discussing options and urgent care vs ED vs PCP evaluation/workup, patient focused on 3 complaints.  1. Eye fatigue Normal exam. Will have patient wear glasses while driving at this time. Follow up with ophthalmology/optometry for recheck needed. If develop URI symptoms, will need COVID testing.  2. Bilateral low back pain Will have patient start NSAIDs. If needed, can add on muscle relaxant. Patient will call if need further symptomatic control. Return precautions given.  3. Penile lesion Declined syphilis testing. Patient in monogamous relationship. Will follow up with PCP for further evaluation if needed. Return precautions given.  Final Clinical Impressions(s) / UC Diagnoses   Final diagnoses:  Eye fatigue, bilateral  Acute bilateral low back pain without sciatica  Penile lesion   ED Prescriptions    None     PDMP not reviewed this encounter.   Ok Edwards, PA-C 06/04/19 1353

## 2019-06-04 NOTE — Discharge Instructions (Signed)
Eye fatigue As discussed, start wearing glasses when driving. Keep hydrated, urine should be clear to pale yellow in color. Follow up with eye doctor to recheck prescription if needed or if symptoms not improving. If develop any cold symptoms, fever, will need COVID testing.   Back pain Start ibuprofen 800mg  three times a day for the next 5-7 days. Follow up with PCP if symptoms not improving.  Penile lesion As discussed, cannot rule out syphilis causing symptoms. Monitor for any injury/trauma/irritation. Otherwise, you have deferred blood draw today. Follow up with PCP for further evaluation needed.

## 2019-06-04 NOTE — ED Triage Notes (Signed)
Pt presents to [facility] for assessment of 2 days of eyes feeling strained "like I've been reading a lot" and also describes it as "dizziness, but not a lot".  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  Pt states 4 days ago he began having bilateral toe pain.  Also c/o lower back pain, more to the left of his lumbar spine.  Denies known injury.  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

## 2019-06-04 NOTE — ED Notes (Signed)
Patient able to ambulate independently  

## 2019-09-11 ENCOUNTER — Emergency Department (HOSPITAL_BASED_OUTPATIENT_CLINIC_OR_DEPARTMENT_OTHER): Payer: BC Managed Care – PPO

## 2019-09-11 ENCOUNTER — Other Ambulatory Visit: Payer: Self-pay

## 2019-09-11 ENCOUNTER — Emergency Department (HOSPITAL_BASED_OUTPATIENT_CLINIC_OR_DEPARTMENT_OTHER)
Admission: EM | Admit: 2019-09-11 | Discharge: 2019-09-12 | Disposition: A | Discharge status: Home / Self Care | Payer: BC Managed Care – PPO | Attending: Emergency Medicine | Admitting: Emergency Medicine

## 2019-09-11 ENCOUNTER — Encounter (HOSPITAL_BASED_OUTPATIENT_CLINIC_OR_DEPARTMENT_OTHER): Payer: Self-pay | Admitting: *Deleted

## 2019-09-11 DIAGNOSIS — R109 Unspecified abdominal pain: Secondary | ICD-10-CM | POA: Diagnosis present

## 2019-09-11 DIAGNOSIS — K5900 Constipation, unspecified: Secondary | ICD-10-CM | POA: Insufficient documentation

## 2019-09-11 HISTORY — DX: Calculus of kidney: N20.0

## 2019-09-11 LAB — COMPREHENSIVE METABOLIC PANEL
ALT: 25 U/L (ref 0–44)
AST: 19 U/L (ref 15–41)
Albumin: 3.9 g/dL (ref 3.5–5.0)
Alkaline Phosphatase: 46 U/L (ref 38–126)
Anion gap: 8 (ref 5–15)
BUN: 13 mg/dL (ref 6–20)
CO2: 27 mmol/L (ref 22–32)
Calcium: 8.9 mg/dL (ref 8.9–10.3)
Chloride: 100 mmol/L (ref 98–111)
Creatinine, Ser: 1.08 mg/dL (ref 0.61–1.24)
GFR calc Af Amer: >60 mL/min (ref >60)
GFR calc non Af Amer: >60 mL/min (ref >60)
Glucose, Bld: 96 mg/dL (ref 70–99)
Potassium: 4.1 mmol/L (ref 3.5–5.1)
Sodium: 135 mmol/L (ref 135–145)
Total Bilirubin: 0.5 mg/dL (ref 0.3–1.2)
Total Protein: 8.3 g/dL — ABNORMAL HIGH (ref 6.5–8.1)

## 2019-09-11 LAB — CBC WITH DIFFERENTIAL/PLATELET
Abs Immature Granulocytes: 0.02 10*3/uL (ref 0.00–0.07)
Basophils Absolute: 0.1 10*3/uL (ref 0.0–0.1)
Basophils Relative: 1 %
Eosinophils Absolute: 0.1 10*3/uL (ref 0.0–0.5)
Eosinophils Relative: 2 %
HCT: 45.2 % (ref 39.0–52.0)
Hemoglobin: 15 g/dL (ref 13.0–17.0)
Immature Granulocytes: 0 %
Lymphocytes Relative: 34 %
Lymphs Abs: 2.6 10*3/uL (ref 0.7–4.0)
MCH: 30.2 pg (ref 26.0–34.0)
MCHC: 33.2 g/dL (ref 30.0–36.0)
MCV: 91.1 fL (ref 80.0–100.0)
Monocytes Absolute: 0.6 10*3/uL (ref 0.1–1.0)
Monocytes Relative: 8 %
Neutro Abs: 4.2 10*3/uL (ref 1.7–7.7)
Neutrophils Relative %: 55 %
Platelets: 287 10*3/uL (ref 150–400)
RBC: 4.96 MIL/uL (ref 4.22–5.81)
RDW: 13.3 % (ref 11.5–15.5)
WBC: 7.6 10*3/uL (ref 4.0–10.5)
nRBC: 0 % (ref 0.0–0.2)

## 2019-09-11 LAB — URINALYSIS, ROUTINE W REFLEX MICROSCOPIC
Bilirubin Urine: NEGATIVE
Glucose, UA: NEGATIVE mg/dL
Hgb urine dipstick: NEGATIVE
Ketones, ur: NEGATIVE mg/dL
Leukocytes,Ua: NEGATIVE
Nitrite: NEGATIVE
Protein, ur: NEGATIVE mg/dL
Specific Gravity, Urine: 1.015 (ref 1.005–1.030)
pH: 6.5 (ref 5.0–8.0)

## 2019-09-11 LAB — D-DIMER, QUANTITATIVE: D-Dimer, Quant: 0.32 ug/mL-FEU (ref 0.00–0.50)

## 2019-09-11 MED ORDER — POLYETHYLENE GLYCOL 3350 17 G PO PACK: 17.0000 g | PACK | Freq: Every day | ORAL | 0 refills | Status: AC

## 2019-09-11 NOTE — ED Provider Notes (Signed)
[facility] DEPARTMENT Provider Note   CSN: [id number] Arrival date & time: 09/11/19  2142     History Chief Complaint  Patient presents with  . Flank Pain    Shawn Howard is a 34 y.o. male.  Pt presents to the ED today with left flank pain.  Pt said it's been there since 3/18.  He denies any trauma.  He denies any sob.  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  Pt denies any f/c.  No urinary sx.  He has not taken any meds for his sx.        Past Medical History:  Diagnosis Date  . Anxiety   . Kidney stone   . Osteoid osteoma of pelvis   . Pyloric stenosis   . Shingles     There are no problems to display for this patient.   Past Surgical History:  Procedure Laterality Date  . ABDOMINAL SURGERY     pyloric stenosis       Family History  Problem Relation Age of Onset  . Diabetes Mother   . Hypertension Father     Social History   Tobacco Use  . Smoking status: Never Smoker  . Smokeless tobacco: Never Used  Substance Use Topics  . Alcohol use: No  . Drug use: No    Home Medications Prior to Admission medications   Medication Sig Start Date End Date Taking? Authorizing Provider  polyethylene glycol (MIRALAX) 17 g packet Take 17 g by mouth daily. 09/11/19   Isla Pence, MD    Allergies    Patient has no known allergies.  Review of Systems   Review of Systems  Genitourinary: Positive for flank pain.  All other systems reviewed and are negative.   Physical Exam Updated Vital Signs BP 125/84 (BP Location: Left Arm)   Pulse 84   Temp 98.3 F (36.8 C) (Oral)   Resp 16   Ht 5\' 8"  (1.727 m)   Wt 80.7 kg   SpO2 100%   BMI 27.06 kg/m   Physical Exam Vitals and nursing note reviewed.  Constitutional:      Appearance: Normal appearance.  HENT:     Head: Normocephalic and atraumatic.     Right Ear: External ear normal.     Left Ear: External ear normal.     Nose: Nose normal.   Mouth/Throat:     Mouth: Mucous membranes are moist.     Pharynx: Oropharynx is clear.  Eyes:     Extraocular Movements: Extraocular movements intact.     Conjunctiva/sclera: Conjunctivae normal.     Pupils: Pupils are equal, round, and reactive to light.  Cardiovascular:     Rate and Rhythm: Normal rate and regular rhythm.     Pulses: Normal pulses.     Heart sounds: Normal heart sounds.  Pulmonary:     Effort: Pulmonary effort is normal.     Breath sounds: Normal breath sounds.  Chest:    Abdominal:     General: Abdomen is flat. Bowel sounds are normal.     Palpations: Abdomen is soft.  Musculoskeletal:        General: Normal range of motion.     Cervical back: Normal range of motion and neck supple.  Skin:    General: Skin is warm.     Capillary Refill: Capillary refill takes less than 2 seconds.  Neurological:     General: No focal  deficit present.     Mental Status: He is alert and oriented to person, place, and time.  Psychiatric:        Mood and Affect: Mood normal.        Behavior: Behavior normal.     ED Results / Procedures / Treatments   Labs (all labs ordered are listed, but only abnormal results are displayed) Labs Reviewed  COMPREHENSIVE METABOLIC PANEL - Abnormal; Notable for the following components:      Result Value   Total Protein 8.3 (*)    All other components within normal limits  CBC WITH DIFFERENTIAL/PLATELET  D-DIMER, QUANTITATIVE (NOT AT [facility])  URINALYSIS, ROUTINE W REFLEX MICROSCOPIC    EKG None  Radiology DG Abdomen Acute W/Chest  Result Date: 09/11/2019 CLINICAL DATA:  Right abdominal pain for 3 days, no fever EXAM: DG ABDOMEN ACUTE W/ 1V CHEST COMPARISON:  CT 04/12/2008 FINDINGS: No high-grade obstructive bowel gas pattern. Mild-to-moderate stool burden. No suspicious calcifications over the gallbladder fossa or urinary tract. Few phleboliths present in the pelvis. Stable appearance of a sclerotic/lucent lesion in the left femur with  narrow zone of transition favoring benignity.Osseous structures are otherwise unremarkable. IMPRESSION: Mild-to-moderate stool burden. No high-grade obstructive bowel gas pattern. Electronically Signed   By: Lovena Le M.D.   On: 09/11/2019 23:18    Procedures Procedures (including critical care time)  Medications Ordered in ED Medications - No data to display  ED Course  I have reviewed the triage vital signs and the nursing notes.  Pertinent labs & imaging results that were available during my care of the patient were reviewed by me and considered in my medical decision making (see chart for details).    MDM Rules/Calculators/A&P                      Pt's x-ray shows some constipation.  Labs are nl.  Final Clinical Impression(s) / ED Diagnoses Final diagnoses:  Constipation, unspecified constipation type    Rx / DC Orders ED Discharge Orders         Ordered    polyethylene glycol (MIRALAX) 17 g packet  Daily     09/11/19 2329           Isla Pence, MD 09/11/19 2330

## 2019-09-11 NOTE — ED Triage Notes (Signed)
Pt reports intermittent sharp left flank pain since Thursday. Denies falls/injury

## 2019-09-11 NOTE — ED Notes (Signed)
Urine cup given and pt aware of need for specimen.

## 2019-11-06 ENCOUNTER — Other Ambulatory Visit: Payer: Self-pay

## 2019-11-06 ENCOUNTER — Emergency Department (HOSPITAL_BASED_OUTPATIENT_CLINIC_OR_DEPARTMENT_OTHER)
Admission: EM | Admit: 2019-11-06 | Discharge: 2019-11-06 | Disposition: A | Discharge status: Home / Self Care | Payer: BC Managed Care – PPO | Attending: Emergency Medicine | Admitting: Emergency Medicine

## 2019-11-06 ENCOUNTER — Encounter (HOSPITAL_BASED_OUTPATIENT_CLINIC_OR_DEPARTMENT_OTHER): Payer: Self-pay | Admitting: Emergency Medicine

## 2019-11-06 DIAGNOSIS — R109 Unspecified abdominal pain: Secondary | ICD-10-CM | POA: Diagnosis present

## 2019-11-06 DIAGNOSIS — R1013 Epigastric pain: Secondary | ICD-10-CM | POA: Insufficient documentation

## 2019-11-06 LAB — COMPREHENSIVE METABOLIC PANEL
ALT: 23 U/L (ref 0–44)
AST: 21 U/L (ref 15–41)
Albumin: 4.5 g/dL (ref 3.5–5.0)
Alkaline Phosphatase: 48 U/L (ref 38–126)
Anion gap: 9 (ref 5–15)
BUN: 10 mg/dL (ref 6–20)
CO2: 26 mmol/L (ref 22–32)
Calcium: 9.4 mg/dL (ref 8.9–10.3)
Chloride: 100 mmol/L (ref 98–111)
Creatinine, Ser: 0.94 mg/dL (ref 0.61–1.24)
GFR calc Af Amer: >60 mL/min (ref >60)
GFR calc non Af Amer: >60 mL/min (ref >60)
Glucose, Bld: 89 mg/dL (ref 70–99)
Potassium: 4.1 mmol/L (ref 3.5–5.1)
Sodium: 135 mmol/L (ref 135–145)
Total Bilirubin: 0.8 mg/dL (ref 0.3–1.2)
Total Protein: 9 g/dL — ABNORMAL HIGH (ref 6.5–8.1)

## 2019-11-06 LAB — CBC
HCT: 48.2 % (ref 39.0–52.0)
Hemoglobin: 16.2 g/dL (ref 13.0–17.0)
MCH: 30.5 pg (ref 26.0–34.0)
MCHC: 33.6 g/dL (ref 30.0–36.0)
MCV: 90.8 fL (ref 80.0–100.0)
Platelets: 304 10*3/uL (ref 150–400)
RBC: 5.31 MIL/uL (ref 4.22–5.81)
RDW: 13.2 % (ref 11.5–15.5)
WBC: 7.5 10*3/uL (ref 4.0–10.5)
nRBC: 0 % (ref 0.0–0.2)

## 2019-11-06 LAB — URINALYSIS, ROUTINE W REFLEX MICROSCOPIC
Bilirubin Urine: NEGATIVE
Glucose, UA: NEGATIVE mg/dL
Hgb urine dipstick: NEGATIVE
Ketones, ur: NEGATIVE mg/dL
Leukocytes,Ua: NEGATIVE
Nitrite: NEGATIVE
Protein, ur: NEGATIVE mg/dL
Specific Gravity, Urine: 1.01 (ref 1.005–1.030)
pH: 7 (ref 5.0–8.0)

## 2019-11-06 LAB — LIPASE, BLOOD: Lipase: 24 U/L (ref 11–51)

## 2019-11-06 NOTE — ED Triage Notes (Signed)
Patient states that he has had abdominal discomfort since Thursday - he feels like he may have had something bad to eat. The patient denies any N/v - reports diffuse pain  - LBM this am and normal

## 2019-11-06 NOTE — ED Provider Notes (Signed)
[place] [facility] EM[facility]ENCY DEPARTMENT Provider Note   CSN: [id number] Arrival date & time: 11/06/19  1658     History Chief Complaint  Patient presents with  . Abdominal Pain    Shawn Howard is a 34 y.o. male.  The history is provided by the patient and medical records. No language interpreter was used.   Shawn Howard is a 34 y.o. male who presents to the Emergency Department complaining of abdominal pain. He complains of epigastric abdominal bloating and discomfort that began after eating [place] food on Thursday. Pain radiates to his left lower quadrant. He described as mild in nature. She denies any fevers, nausea, vomiting, diarrhea, constipation, dysuria. He describes it as a big uncomfortable sensation. He is able to eat and drink without difficulty. He has a history of prior surgery for pyloric stenosis as an infant. He denies any medical problems.    Past Medical History:  Diagnosis Date  . Anxiety   . Kidney stone   . Osteoid osteoma of pelvis   . Pyloric stenosis   . Shingles     There are no problems to display for this patient.   Past Surgical History:  Procedure Laterality Date  . ABDOMINAL SURGERY     pyloric stenosis       Family History  Problem Relation Age of Onset  . Diabetes Mother   . Hypertension Father     Social History   Tobacco Use  . Smoking status: Never Smoker  . Smokeless tobacco: Never Used  Substance Use Topics  . Alcohol use: No  . Drug use: No    Home Medications Prior to Admission medications   Medication Sig Start Date End Date Taking? Authorizing Provider  polyethylene glycol (MIRALAX) 17 g packet Take 17 g by mouth daily. 09/11/19   Isla Pence, MD    Allergies    Patient has no known allergies.  Review of Systems   Review of Systems  All other systems reviewed and are negative.   Physical Exam Updated Vital Signs BP 138/81 (BP Location: Right Arm)   Pulse 69   Temp 99 F (37.2 C) (Oral)    Resp 20   Ht 5\' 8"  (1.727 m)   Wt 77.1 kg   SpO2 98%   BMI 25.85 kg/m   Physical Exam Vitals and nursing note reviewed.  Constitutional:      Appearance: He is well-developed.  HENT:     Head: Normocephalic and atraumatic.  Cardiovascular:     Rate and Rhythm: Normal rate and regular rhythm.  Pulmonary:     Effort: Pulmonary effort is normal. No respiratory distress.  Abdominal:     Palpations: Abdomen is soft.     Tenderness: There is no abdominal tenderness. There is no guarding or rebound.     Comments: Well healed surgical scar in the RUQ  Musculoskeletal:        General: No tenderness.  Skin:    General: Skin is warm and dry.  Neurological:     Mental Status: He is alert and oriented to person, place, and time.  Psychiatric:        Behavior: Behavior normal.     ED Results / Procedures / Treatments   Labs (all labs ordered are listed, but only abnormal results are displayed) Labs Reviewed  COMPREHENSIVE METABOLIC PANEL - Abnormal; Notable for the following components:      Result Value   Total Protein 9.0 (*)    All other  components within normal limits  LIPASE, BLOOD  CBC  URINALYSIS, ROUTINE W REFLEX MICROSCOPIC    EKG None  Radiology No results found.  Procedures Procedures (including critical care time)  Medications Ordered in ED Medications - No data to display  ED Course  I have reviewed the triage vital signs and the nursing notes.  Pertinent labs & imaging results that were available during my care of the patient were reviewed by me and considered in my medical decision making (see chart for details).    MDM Rules/Calculators/A&P                     Patient here for evaluation of abdominal bloating and discomfort. He is no significant tenderness on examination. Labs with no change in his pancreatic function, LFTs or renal function. UA is not consistent with UTI. Current presentation is not consistent with cholecystitis, pancreatitis,  perforated viscous, peptic ulcer disease. Discussed with patient home care for abdominal bloating. Offered symptomatic treatment with medications and patient declines. Discussed return precautions.  Final Clinical Impression(s) / ED Diagnoses Final diagnoses:  Epigastric pain    Rx / DC Orders ED Discharge Orders    None       Quintella Reichert, MD 11/06/19 2014

## 2020-04-23 ENCOUNTER — Emergency Department (HOSPITAL_BASED_OUTPATIENT_CLINIC_OR_DEPARTMENT_OTHER): Payer: BC Managed Care – PPO

## 2020-04-23 ENCOUNTER — Encounter (HOSPITAL_COMMUNITY): Payer: Self-pay | Admitting: Emergency Medicine

## 2020-04-23 ENCOUNTER — Emergency Department (HOSPITAL_COMMUNITY)
Admission: EM | Admit: 2020-04-23 | Discharge: 2020-04-23 | Disposition: A | Discharge status: Home / Self Care | Payer: BC Managed Care – PPO | Attending: Emergency Medicine | Admitting: Emergency Medicine

## 2020-04-23 ENCOUNTER — Other Ambulatory Visit: Payer: Self-pay

## 2020-04-23 DIAGNOSIS — M79609 Pain in unspecified limb: Secondary | ICD-10-CM | POA: Diagnosis not present

## 2020-04-23 DIAGNOSIS — M79662 Pain in left lower leg: Secondary | ICD-10-CM | POA: Insufficient documentation

## 2020-04-23 LAB — BASIC METABOLIC PANEL
Anion gap: 11 (ref 5–15)
BUN: 7 mg/dL (ref 6–20)
CO2: 26 mmol/L (ref 22–32)
Calcium: 9.4 mg/dL (ref 8.9–10.3)
Chloride: 100 mmol/L (ref 98–111)
Creatinine, Ser: 1 mg/dL (ref 0.61–1.24)
GFR, Estimated: >60 mL/min (ref >60)
Glucose, Bld: 112 mg/dL — ABNORMAL HIGH (ref 70–99)
Potassium: 3.8 mmol/L (ref 3.5–5.1)
Sodium: 137 mmol/L (ref 135–145)

## 2020-04-23 LAB — CBC
HCT: 44.5 % (ref 39.0–52.0)
Hemoglobin: 14.5 g/dL (ref 13.0–17.0)
MCH: 29.5 pg (ref 26.0–34.0)
MCHC: 32.6 g/dL (ref 30.0–36.0)
MCV: 90.4 fL (ref 80.0–100.0)
Platelets: 284 10*3/uL (ref 150–400)
RBC: 4.92 MIL/uL (ref 4.22–5.81)
RDW: 13.1 % (ref 11.5–15.5)
WBC: 5.3 10*3/uL (ref 4.0–10.5)
nRBC: 0 % (ref 0.0–0.2)

## 2020-04-23 NOTE — ED Provider Notes (Signed)
[facility]ERGENC[facility] DEPARTMENT Provider Note   CSN: [id number] Arrival date & time: 04/23/20  1345     History Chief Complaint  Patient presents with  . Leg Pain    Shawn Howard is a 34 y.o. male.  HPI      Shawn Howard is a 34 y.o. male, with a history of anxiety, presenting to the ED with left calf pain for about the past week. Patient states the discomfort feels like a soreness or tightness, "like a pulled muscle," came on after working out with running, has been intermittently present since then, nonradiating.  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 He denies history of blood clot. Denies numbness, weakness, known trauma, swelling, color change, recent procedures to the area, chest pain, shortness of breath, or any other complaints.   Past Medical History:  Diagnosis Date  . Anxiety   . Kidney stone   . Osteoid osteoma of pelvis   . Pyloric stenosis   . Shingles     There are no problems to display for this patient.   Past Surgical History:  Procedure Laterality Date  . ABDOMINAL SURGERY     pyloric stenosis       Family History  Problem Relation Age of Onset  . Diabetes Mother   . Hypertension Father     Social History   Tobacco Use  . Smoking status: Never Smoker  . Smokeless tobacco: Never Used  Vaping Use  . Vaping Use: Never used  Substance Use Topics  . Alcohol use: No  . Drug use: No    Home Medications Prior to Admission medications   Medication Sig Start Date End Date Taking? Authorizing Provider  polyethylene glycol (MIRALAX) 17 g packet Take 17 g by mouth daily. 09/11/19   Isla Pence, MD    Allergies    Patient has no known allergies.  Review of Systems   Review of Systems  Constitutional: Negative for chills and fever.  Respiratory: Negative for shortness of breath.   Cardiovascular: Negative for chest pain.  Musculoskeletal:  Positive for myalgias. Negative for arthralgias.  Neurological: Negative for weakness and numbness.    Physical Exam Updated Vital Signs BP 138/85   Pulse 78   Temp 98.6 F (37 C) (Oral)   Resp 18   Ht 5\' 8"  (1.727 m)   Wt 74.8 kg   SpO2 100%   BMI 25.09 kg/m   Physical Exam Vitals and nursing note reviewed.  Constitutional:      General: He is not in acute distress.    Appearance: He is well-developed. He is not diaphoretic.  HENT:     Head: Normocephalic and atraumatic.  Eyes:     Conjunctiva/sclera: Conjunctivae normal.  Cardiovascular:     Rate and Rhythm: Normal rate and regular rhythm.     Pulses:          Dorsalis pedis pulses are 2+ on the left side.       Posterior tibial pulses are 2+ on the left side.  Pulmonary:     Effort: Pulmonary effort is normal. No respiratory distress.     Comments: No increased work of breathing.  Speaks in full sentences without difficulty. Musculoskeletal:     Cervical back: Neck supple.     Comments: No tenderness, swelling, color change, increased warmth, or other abnormality noted to the left lower extremity.  Full range of motion of the left knee and ankle without pain or noted difficulty.  Skin:    General: Skin is warm and dry.     Coloration: Skin is not pale.  Neurological:     Mental Status: He is alert.     Comments: Sensation light touch grossly intact in the left lower extremity. Strength 5/5 in the left lower extremity. Ambulatory without assistance or gait deficit.  Psychiatric:        Behavior: Behavior normal.     ED Results / Procedures / Treatments   Labs (all labs ordered are listed, but only abnormal results are displayed) Labs Reviewed  BASIC METABOLIC PANEL - Abnormal; Notable for the following components:      Result Value   Glucose, Bld 112 (*)    All other components within normal limits  CBC    EKG None  Radiology VAS [place] LOWER EXTREMITY VENOUS (DVT) (ONLY [facility] & [facility])  Result Date:  04/23/2020  Lower Venous DVT Study Indications: Calf pain.  Comparison Study: No prior study Performing Technologist: Sharion Dove RVS  Examination Guidelines: A complete evaluation includes B-mode imaging, spectral Doppler, color Doppler, and power Doppler as needed of all accessible portions of each vessel. Bilateral testing is considered an integral part of a complete examination. Limited examinations for reoccurring indications may be performed as noted. The reflux portion of the exam is performed with the patient in reverse Trendelenburg.  +-----+---------------+---------+-----------+----------+--------------+ RIGHTCompressibilityPhasicitySpontaneityPropertiesThrombus Aging +-----+---------------+---------+-----------+----------+--------------+ CFV  Full           Yes      Yes                                 +-----+---------------+---------+-----------+----------+--------------+   +---------+---------------+---------+-----------+----------+--------------+ LEFT     CompressibilityPhasicitySpontaneityPropertiesThrombus Aging +---------+---------------+---------+-----------+----------+--------------+ CFV      Full           Yes      Yes                                 +---------+---------------+---------+-----------+----------+--------------+ SFJ      Full                                                        +---------+---------------+---------+-----------+----------+--------------+ FV Prox  Full                                                        +---------+---------------+---------+-----------+----------+--------------+ FV Mid   Full                                                        +---------+---------------+---------+-----------+----------+--------------+ FV DistalFull                                                        +---------+---------------+---------+-----------+----------+--------------+  PFV      Full                                                         +---------+---------------+---------+-----------+----------+--------------+ POP      Full           Yes      Yes                                 +---------+---------------+---------+-----------+----------+--------------+ PTV      Full                                                        +---------+---------------+---------+-----------+----------+--------------+ PERO     Full                                                        +---------+---------------+---------+-----------+----------+--------------+     Summary: RIGHT: - No evidence of common femoral vein obstruction.  LEFT: - There is no evidence of deep vein thrombosis in the lower extremity.  *See table(s) above for measurements and observations.    Preliminary     Procedures Procedures (including critical care time)  Medications Ordered in ED Medications - No data to display  ED Course  I have reviewed the triage vital signs and the nursing notes.  Pertinent labs & imaging results that were available during my care of the patient were reviewed by me and considered in my medical decision making (see chart for details).    MDM Rules/Calculators/A&P                          Patient presents with now intermittent left calf pain.  No evidence of neurovascular compromise on exam.  No current pain and no reproducible pain on exam. Vascular ultrasound without evidence of DVT.  No clinical evidence of DVT. The patient was given instructions for home care as well as return precautions. Patient voices understanding of these instructions, accepts the plan, and is comfortable with discharge.    Final Clinical Impression(s) / ED Diagnoses Final diagnoses:  Pain of left calf    Rx / DC Orders ED Discharge Orders    None       Layla Maw 04/23/20 1703    Drenda Freeze, MD 04/23/20 2159

## 2020-04-23 NOTE — Progress Notes (Signed)
VASCULAR LAB    Left lower extremity venous duplex has been performed.  See CV proc for preliminary results.  Gave verbal report to Karolee Stamps, RN  Mauro Kaufmann, [place], RVT 04/23/2020, 3:42 PM

## 2020-04-23 NOTE — Discharge Instructions (Addendum)
Findings today were reassuring. Follow-up with your primary care provider or orthopedic specialist.  Antiinflammatory medications: Take 600 mg of ibuprofen every 6 hours or 440 mg (over the counter dose) to 500 mg (prescription dose) of naproxen every 12 hours for the next 3 days. After this time, these medications may be used as needed for pain. Take these medications with food to avoid upset stomach. Choose only one of these medications, do not take them together. Acetaminophen (generic for Tylenol): Should you continue to have additional pain while taking the ibuprofen or naproxen, you may add in acetaminophen as needed. Your daily total maximum amount of acetaminophen from all sources should be limited to 4000mg /day for persons without liver problems, or 2000mg /day for those with liver problems.

## 2020-04-23 NOTE — ED Triage Notes (Signed)
Pt coming from home. Complaint of left leg pain x1 week. Pt states leg had been painful and achy. 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 VSS. NAD.

## 2020-09-13 ENCOUNTER — Emergency Department (HOSPITAL_COMMUNITY)
Admission: EM | Admit: 2020-09-13 | Discharge: 2020-09-13 | Disposition: A | Discharge status: Home / Self Care | Payer: BC Managed Care – PPO | Attending: Emergency Medicine | Admitting: Emergency Medicine

## 2020-09-13 ENCOUNTER — Emergency Department (HOSPITAL_COMMUNITY): Payer: BC Managed Care – PPO

## 2020-09-13 ENCOUNTER — Encounter (HOSPITAL_COMMUNITY): Payer: Self-pay

## 2020-09-13 ENCOUNTER — Other Ambulatory Visit: Payer: Self-pay

## 2020-09-13 DIAGNOSIS — R079 Chest pain, unspecified: Secondary | ICD-10-CM

## 2020-09-13 DIAGNOSIS — R072 Precordial pain: Secondary | ICD-10-CM | POA: Diagnosis present

## 2020-09-13 LAB — BASIC METABOLIC PANEL
Anion gap: 7 (ref 5–15)
BUN: 8 mg/dL (ref 6–20)
CO2: 26 mmol/L (ref 22–32)
Calcium: 9.3 mg/dL (ref 8.9–10.3)
Chloride: 102 mmol/L (ref 98–111)
Creatinine, Ser: 0.97 mg/dL (ref 0.61–1.24)
GFR, Estimated: >60 mL/min (ref >60)
Glucose, Bld: 100 mg/dL — ABNORMAL HIGH (ref 70–99)
Potassium: 4.4 mmol/L (ref 3.5–5.1)
Sodium: 135 mmol/L (ref 135–145)

## 2020-09-13 LAB — CBC
HCT: 43.5 % (ref 39.0–52.0)
Hemoglobin: 14.5 g/dL (ref 13.0–17.0)
MCH: 30.6 pg (ref 26.0–34.0)
MCHC: 33.3 g/dL (ref 30.0–36.0)
MCV: 91.8 fL (ref 80.0–100.0)
Platelets: 269 10*3/uL (ref 150–400)
RBC: 4.74 MIL/uL (ref 4.22–5.81)
RDW: 13.5 % (ref 11.5–15.5)
WBC: 6 10*3/uL (ref 4.0–10.5)
nRBC: 0 % (ref 0.0–0.2)

## 2020-09-13 LAB — TROPONIN I (HIGH SENSITIVITY): Troponin I (High Sensitivity): <2 ng/L (ref <18)

## 2020-09-13 NOTE — Discharge Instructions (Addendum)
Your workup was reassuring at this time  Please follow up with your PCP regarding your ED visit today  Return to the ED IMMEDIATELY for any worsening symptoms

## 2020-09-13 NOTE — ED Provider Notes (Signed)
[name] EMERGENCY DEPARTMENT Provider Note   CSN: [id number] Arrival date & time: 09/13/20  1026     History No chief complaint on file.   Shawn Howard is a 35 y.o. male who presents to the ED today with complaint of gradual onset, constant, sharp, substernal chest pain that occurred around 10 AM this morning.  Patient states he was just getting off work when he began feeling this discomfort.  He equates it to indigestion however he states that it lasted approximately 1 hour which concerned him.  He tried to drink some water without relief and then decided to come to the ED to make sure that he was okay.  While in the waiting room getting vitals his pain completely resolved.  Denies any other associated symptoms including diaphoresis, shortness of breath, nausea, vomiting, leg swelling, any other associated symptoms.  Patient reports he last ate around 4 hours prior to his pain beginning however denies anything very spicy, greasy, fattening.  He denies any abdominal pain.  He is a never smoker.  No family history of CAD.  Patient exercises quite regularly and states that he never has chest pain with the same.   The history is provided by the patient and medical records.    HPI: A 35 year old patient presents for evaluation of chest pain. Initial onset of pain was approximately 1-3 hours ago. The patient's chest pain is well-localized and is not worse with exertion. The patient's chest pain is middle- or left-sided, is not described as heaviness/pressure/tightness, is not sharp and does not radiate to the arms/jaw/neck. The patient does not complain of nausea and denies diaphoresis. The patient has no history of stroke, has no history of peripheral artery disease, has not smoked in the past 90 days, denies any history of treated diabetes, has no relevant family history of coronary artery disease (first degree relative at less than age 1), is not hypertensive, has no history  of hypercholesterolemia and does not have an elevated BMI (>=30).   Past Medical History:  Diagnosis Date  . Anxiety   . Kidney stone   . Osteoid osteoma of pelvis   . Pyloric stenosis   . Shingles     There are no problems to display for this patient.   Past Surgical History:  Procedure Laterality Date  . ABDOMINAL SURGERY     pyloric stenosis       Family History  Problem Relation Age of Onset  . Diabetes Mother   . Hypertension Father     Social History   Tobacco Use  . Smoking status: Never Smoker  . Smokeless tobacco: Never Used  Vaping Use  . Vaping Use: Never used  Substance Use Topics  . Alcohol use: No  . Drug use: No    Home Medications Prior to Admission medications   Medication Sig Start Date End Date Taking? Authorizing Provider  polyethylene glycol (MIRALAX) 17 g packet Take 17 g by mouth daily. 09/11/19   Shawn Pence, MD    Allergies    Patient has no known allergies.  Review of Systems   Review of Systems  Constitutional: Negative for chills and fever.  Respiratory: Negative for cough and shortness of breath.   Cardiovascular: Positive for chest pain. Negative for palpitations and leg swelling.  Gastrointestinal: Negative for abdominal pain, nausea and vomiting.  All other systems reviewed and are negative.   Physical Exam Updated Vital Signs BP (!) 156/47 (BP Location: Right Arm)  Pulse 77   Temp 98.5 F (36.9 C)   Resp 16   SpO2 98%   Physical Exam Vitals and nursing note reviewed.  Constitutional:      Appearance: He is not ill-appearing or diaphoretic.  HENT:     Head: Normocephalic and atraumatic.  Eyes:     Conjunctiva/sclera: Conjunctivae normal.  Cardiovascular:     Rate and Rhythm: Normal rate and regular rhythm.     Pulses: Normal pulses.     Comments: 2+ radial and 2+ PT Pulses bilaterally Pulmonary:     Effort: Pulmonary effort is normal.     Breath sounds: Normal breath sounds. No stridor. No wheezing,  rhonchi or rales.  Abdominal:     Palpations: Abdomen is soft.     Tenderness: There is no abdominal tenderness. There is no guarding or rebound.  Musculoskeletal:        General: No tenderness.     Cervical back: Neck supple.     Right lower leg: No edema.     Left lower leg: No edema.  Skin:    General: Skin is warm and dry.  Neurological:     Mental Status: He is alert.     ED Results / Procedures / Treatments   Labs (all labs ordered are listed, but only abnormal results are displayed) Labs Reviewed  BASIC METABOLIC PANEL - Abnormal; Notable for the following components:      Result Value   Glucose, Bld 100 (*)    All other components within normal limits  CBC  TROPONIN I (HIGH SENSITIVITY)    EKG EKG Interpretation  Date/Time:  Wednesday September 13 2020 10:38:32 EDT Ventricular Rate:  77 PR Interval:  140 QRS Duration: 84 QT Interval:  358 QTC Calculation: 405 R Axis:   88 Text Interpretation: Normal sinus rhythm with sinus arrhythmia Normal ECG No significant change since last tracing Confirmed by Blanchie Dessert [phone]) on 09/13/2020 1:16:12 PM   Radiology DG Chest 2 View  Result Date: 09/13/2020 CLINICAL DATA:  Chest pain. EXAM: CHEST - 2 VIEW COMPARISON:  May 28, 2013. FINDINGS: The heart size and mediastinal contours are within normal limits. Both lungs are clear. No pneumothorax or pleural effusion is noted. The visualized skeletal structures are unremarkable. IMPRESSION: No active cardiopulmonary disease. Electronically Signed   By: Marijo Conception M.D.   On: 09/13/2020 11:16    Procedures Procedures   Medications Ordered in ED Medications - No data to display  ED Course  I have reviewed the triage vital signs and the nursing notes.  Pertinent labs & imaging results that were available during my care of the patient were reviewed by me and considered in my medical decision making (see chart for details).    MDM Rules/Calculators/A&P HEAR  Score: 0                        35 year old male who presents to the ED today with complaint of substernal chest pain that began around 10 AM today and lasted about 1 hour.  No other associated symptoms.  Patient equates his pain to indigestion.  He is currently chest pain-free.  No other associated symptoms.  He has been family history of CAD and is a never smoker.  He exercises regularly and never has chest pain with same.  On arrival to the ED vitals are stable.  Patient is afebrile, nontachycardic, nontachypneic.  He appears to be in no acute distress.  His  pain stopped while he was in triage getting vitals.  He had an EKG done on arrival not any significant change from last tracing.  Chest x-ray clear.  CBC without leukocytosis.  Hemoglobin stable at 14.5.  BMP without electrolyte abnormalities.  Initial troponin less than 2.  Patient's heart score is 0, do not feel he needs repeat troponin at this time.  On exam he currently has no chest pain.  He has equal pulses bilaterally to his upper and lower extremities.  Blood pressure is stable.  He denies any pleuritic chest pain.  Patient is PERC negative.  Low suspicion for PE.  Will discharge home with close PCP follow-up at this time.  I do not feel patient needs additional work-up today.  He is instructed to return to the ED for any worsening symptoms.  Stable for discharge.   This note was prepared using Dragon voice recognition software and may include unintentional dictation errors due to the inherent limitations of voice recognition software.  Final Clinical Impression(s) / ED Diagnoses Final diagnoses:  Nonspecific chest pain    Rx / DC Orders ED Discharge Orders    None       Discharge Instructions     Your workup was reassuring at this time  Please follow up with your PCP regarding your ED visit today  Return to the ED IMMEDIATELY for any worsening symptoms       Eustaquio Maize, PA-C 09/13/20 1339    Blanchie Dessert,  MD 09/13/20 1456

## 2020-09-13 NOTE — ED Triage Notes (Signed)
Patient complains of indigestion feeling and chest discomfort this am. patient has also had some left shoulder pain that he thinks related to strength training.

## 2020-10-18 ENCOUNTER — Emergency Department (HOSPITAL_COMMUNITY)
Admission: EM | Admit: 2020-10-18 | Discharge: 2020-10-18 | Disposition: A | Discharge status: Home / Self Care | Payer: BC Managed Care – PPO | Attending: Emergency Medicine | Admitting: Emergency Medicine

## 2020-10-18 ENCOUNTER — Emergency Department (HOSPITAL_COMMUNITY): Payer: BC Managed Care – PPO

## 2020-10-18 ENCOUNTER — Encounter (HOSPITAL_COMMUNITY): Payer: Self-pay

## 2020-10-18 ENCOUNTER — Other Ambulatory Visit: Payer: Self-pay

## 2020-10-18 DIAGNOSIS — Z20822 Contact with and (suspected) exposure to covid-19: Secondary | ICD-10-CM

## 2020-10-18 DIAGNOSIS — R059 Cough, unspecified: Secondary | ICD-10-CM

## 2020-10-18 DIAGNOSIS — U071 COVID-19: Secondary | ICD-10-CM | POA: Insufficient documentation

## 2020-10-18 DIAGNOSIS — Z28310 Unvaccinated for covid-19: Secondary | ICD-10-CM | POA: Diagnosis not present

## 2020-10-18 LAB — SARS CORONAVIRUS 2 (TAT 6-24 HRS): SARS Coronavirus 2: POSITIVE — AB

## 2020-10-18 MED ORDER — BENZONATATE 100 MG PO CAPS: 100.0000 mg | ORAL_CAPSULE | Freq: Three times a day (TID) | ORAL | 0 refills | Status: AC

## 2020-10-18 NOTE — ED Triage Notes (Signed)
Emergency Medicine Provider Triage Evaluation Note  Shawn Howard , Howard 35 y.o. male  was evaluated in triage.  Pt complains of covid symptoms. Positive co workers. Cough, sob, sore throat, myalgias. unvaccinated  Review of Systems  Positive: Cough, sob, sore throat Negative: *fever, cp, abd pain, emesis  Physical Exam  BP 133/82   Pulse 84   Temp 99.3 F (37.4 C) (Oral)   Resp 16   Ht 5\' 8"  (1.727 m)   Wt 70.3 kg   SpO2 100%   BMI 23.57 kg/m  Gen:   Awake, no distress   HEENT:  Atraumatic  Resp:  Normal effort  Cardiac:  Normal rate  Abd:   Nondistended, nontender  MSK:   Moves extremities without difficulty  Neuro:  Speech clear   Medical Decision Making  Medically screening exam initiated at 6:45 AM.  Appropriate orders placed.  Shawn Howard was informed that the remainder of the evaluation will be completed by another provider, this initial triage assessment does not replace that evaluation, and the importance of remaining in the ED until their evaluation is complete.  Clinical Impression  Stable, covid vs UR    Shawn Wojnar A, PA-C 10/18/20 [phone]

## 2020-10-18 NOTE — ED Triage Notes (Signed)
Shortness of breath, non productive cough, chills and fatigue since Sunday night. Has been exposed to covid positive co-workers.

## 2020-10-18 NOTE — ED Provider Notes (Signed)
[facility] EMERGENCY DEPARTMENT Provider Note   CSN: [id number] Arrival date & time: 10/18/20  8099     History Chief Complaint  Patient presents with  . Cough  . Shortness of Breath    Shawn Howard is a 35 y.o. male.  HPI Patient presents due to cough, congestion, myalgia, chills. He is generally well, takes no medication regularly.  He does not smoke. He has multiple coworkers have tested positive for [place] [employer]. He is not vaccinated.  This illness began about 4 days ago, since that time has been persistent, though generally improving.  However, overnight the patient had a prolonged coughing episode, and with chills, now presents for evaluation. He has taken no new medication for relief, there are no clear alleviating or exacerbating factors.    Past Medical History:  Diagnosis Date  . Anxiety   . Kidney stone   . Osteoid osteoma of pelvis   . Pyloric stenosis   . Shingles     There are no problems to display for this patient.   Past Surgical History:  Procedure Laterality Date  . ABDOMINAL SURGERY     pyloric stenosis       Family History  Problem Relation Age of Onset  . Diabetes Mother   . Hypertension Father     Social History   Tobacco Use  . Smoking status: Never Smoker  . Smokeless tobacco: Never Used  Vaping Use  . Vaping Use: Never used  Substance Use Topics  . Alcohol use: No  . Drug use: No    Home Medications Prior to Admission medications   Medication Sig Start Date End Date Taking? Authorizing Provider  benzonatate (TESSALON) 100 MG capsule Take 1 capsule (100 mg total) by mouth every 8 (eight) hours. 10/18/20  Yes Carmin Muskrat, MD  polyethylene glycol (MIRALAX) 17 g packet Take 17 g by mouth daily. 09/11/19   Isla Pence, MD    Allergies    Patient has no known allergies.  Review of Systems   Review of Systems  Constitutional:       Per HPI, otherwise negative  HENT:       Per HPI, otherwise negative   Respiratory:       Per HPI, otherwise negative  Cardiovascular:       Per HPI, otherwise negative  Gastrointestinal: Negative for vomiting.  Endocrine:       Negative aside from HPI  Genitourinary:       Neg aside from HPI   Musculoskeletal:       Per HPI, otherwise negative  Skin: Negative.   Neurological: Negative for syncope.    Physical Exam Updated Vital Signs BP 126/75   Pulse 71   Temp 99.3 F (37.4 C) (Oral)   Resp 18   Ht 5\' 8"  (1.727 m)   Wt 70.3 kg   SpO2 98%   BMI 23.57 kg/m   Physical Exam Vitals and nursing note reviewed.  Constitutional:      General: He is not in acute distress.    Appearance: He is well-developed.  HENT:     Head: Normocephalic and atraumatic.  Eyes:     Conjunctiva/sclera: Conjunctivae normal.  Cardiovascular:     Rate and Rhythm: Normal rate and regular rhythm.  Pulmonary:     Effort: Pulmonary effort is normal. No respiratory distress.     Breath sounds: No stridor.  Abdominal:     General: There is no distension.  Skin:  General: Skin is warm and dry.  Neurological:     Mental Status: He is alert and oriented to person, place, and time.     ED Results / Procedures / Treatments   Labs (all labs ordered are listed, but only abnormal results are displayed) Labs Reviewed  SARS CORONAVIRUS 2 (TAT 6-24 HRS)    EKG EKG Interpretation  Date/Time:  Wednesday October 18 2020 06:39:03 EDT Ventricular Rate:  81 PR Interval:  150 QRS Duration: 82 QT Interval:  342 QTC Calculation: 397 R Axis:   96 Text Interpretation: Normal sinus rhythm Rightward axis minor artefact, Otherwise within normal limits Confirmed by Carmin Muskrat [phone]) on 10/18/2020 8:34:12 AM   Radiology DG Chest 2 View  Result Date: 10/18/2020 CLINICAL DATA:  Cough.  Shortness of breath. EXAM: CHEST - 2 VIEW COMPARISON:  09/13/2020. FINDINGS: Mediastinum and hilar structures normal. No focal infiltrate. No pleural effusion or pneumothorax. Heart size  normal. Mild thoracic spine scoliosis. IMPRESSION: No acute cardiopulmonary disease.  Stable chest from 09/13/2020. Electronically Signed   By: Marcello Moores  Register   On: 10/18/2020 07:09    Procedures Procedures   Medications Ordered in ED Medications - No data to display  ED Course  I have reviewed the triage vital signs and the nursing notes.  Pertinent labs & imaging results that were available during my care of the patient were reviewed by me and considered in my medical decision making (see chart for details).  Have reviewed his x-ray, agree with interpretation, pulse oximetry 99% room air normal  This adult male without medical problems presents with COVID-like illness, but no evidence for hemodynamic compromise, bacteremia, sepsis.  X-ray does not show pneumonia.  Given his youth, general good health patient had COVID testing performed, was discharged with return precautions, follow-up instructions and ongoing symptomatic relief. Final Clinical Impression(s) / ED Diagnoses Final diagnoses:  Cough  Exposure to COVID-19 virus    Rx / DC Orders ED Discharge Orders         Ordered    benzonatate (TESSALON) 100 MG capsule  Every 8 hours        10/18/20 0837           Carmin Muskrat, MD 10/18/20 [phone]

## 2020-10-18 NOTE — Discharge Instructions (Addendum)
As discussed, a COVID test has been sent and results should be available overnight.  Please monitor your condition carefully and do not hesitate to return if you feel any concerning changes in your condition.  Otherwise follow-up with your physician.  You have been prescribed a cough medication.  Please obtain and use this as directed, as needed.

## 2021-04-12 IMAGING — DX DG CHEST 2V
2 series · 2 of 2 positions shown · non-contrast
Comparison: May 28, 2013.

CLINICAL DATA: Chest pain.

EXAM:
CHEST - 2 VIEW

[chest pa]
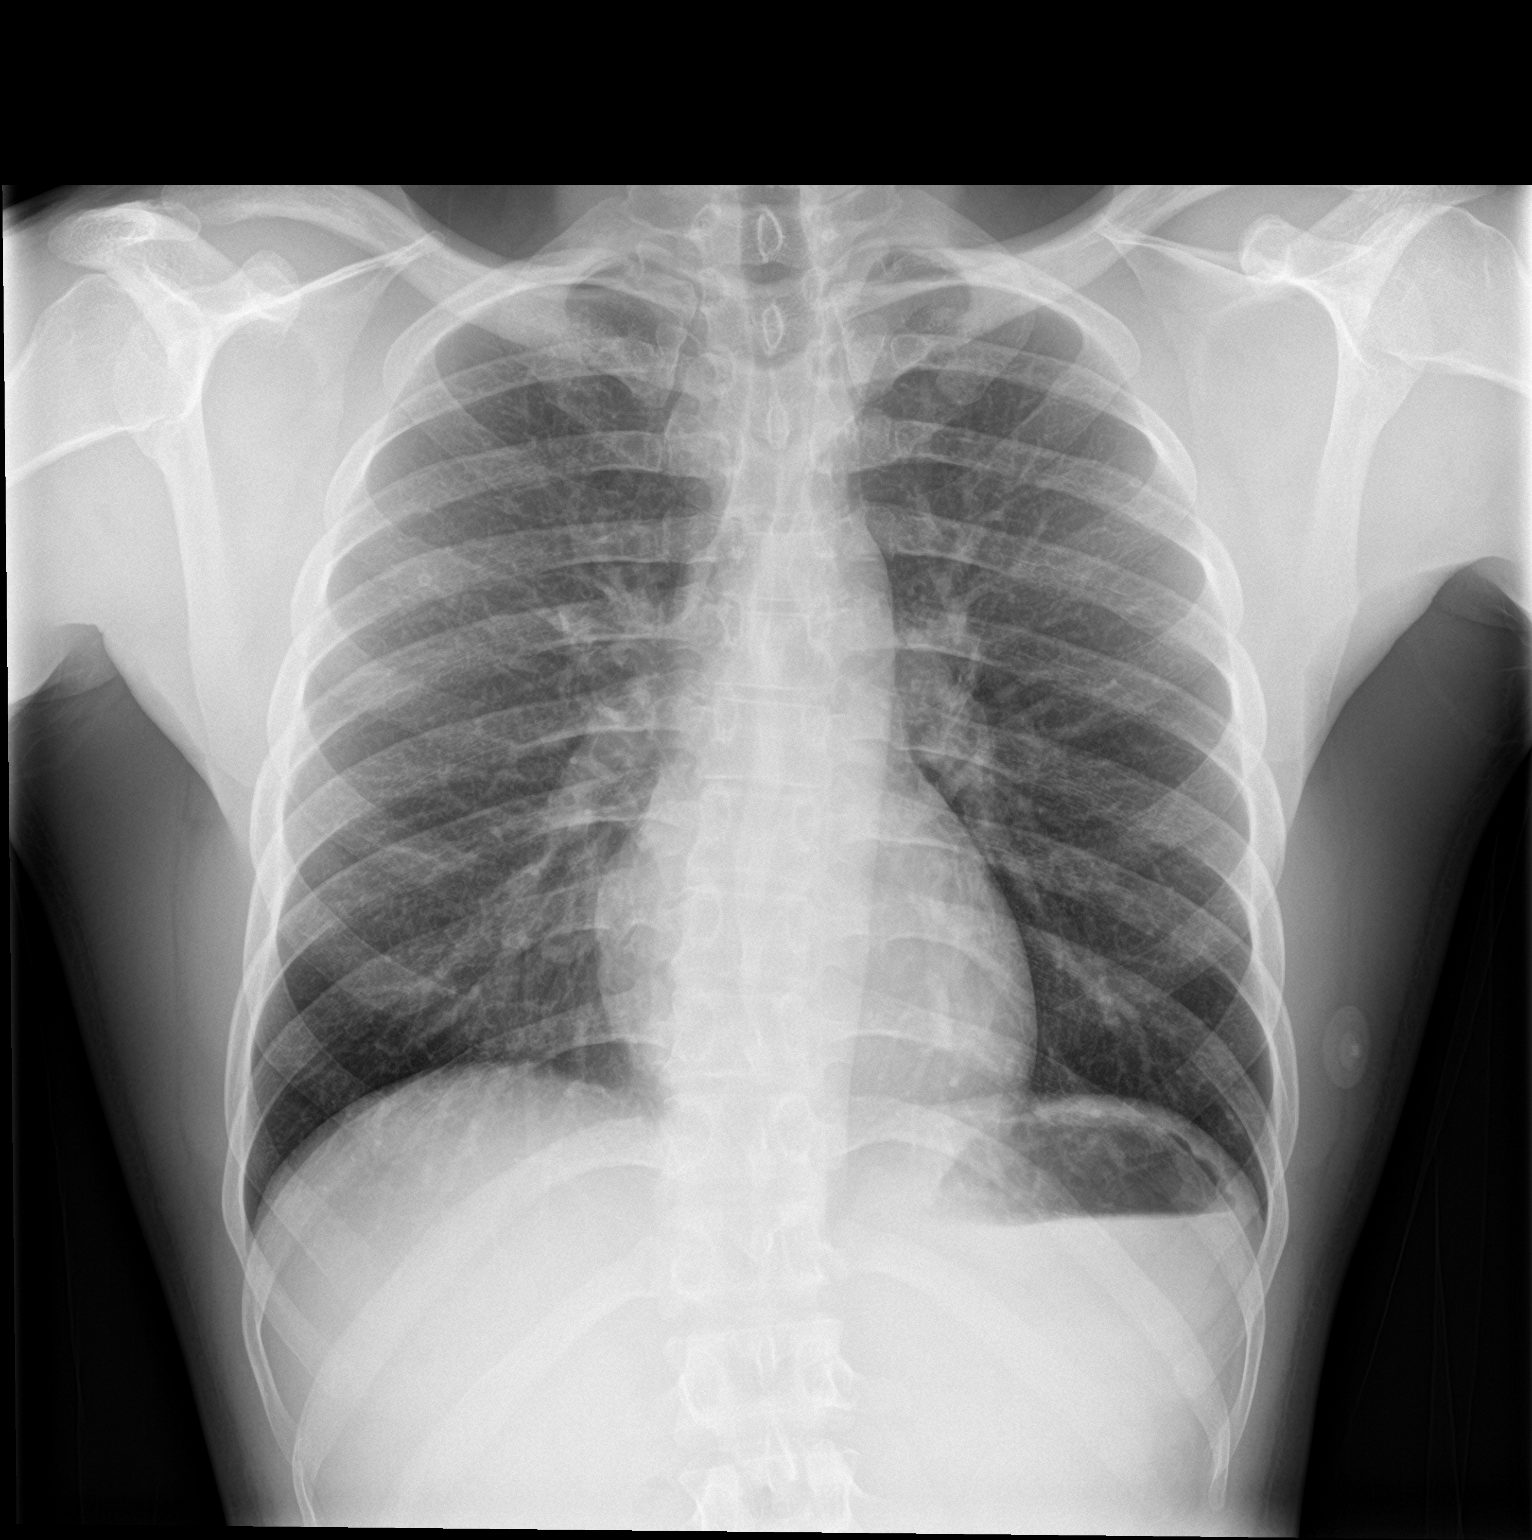

[chest lat]
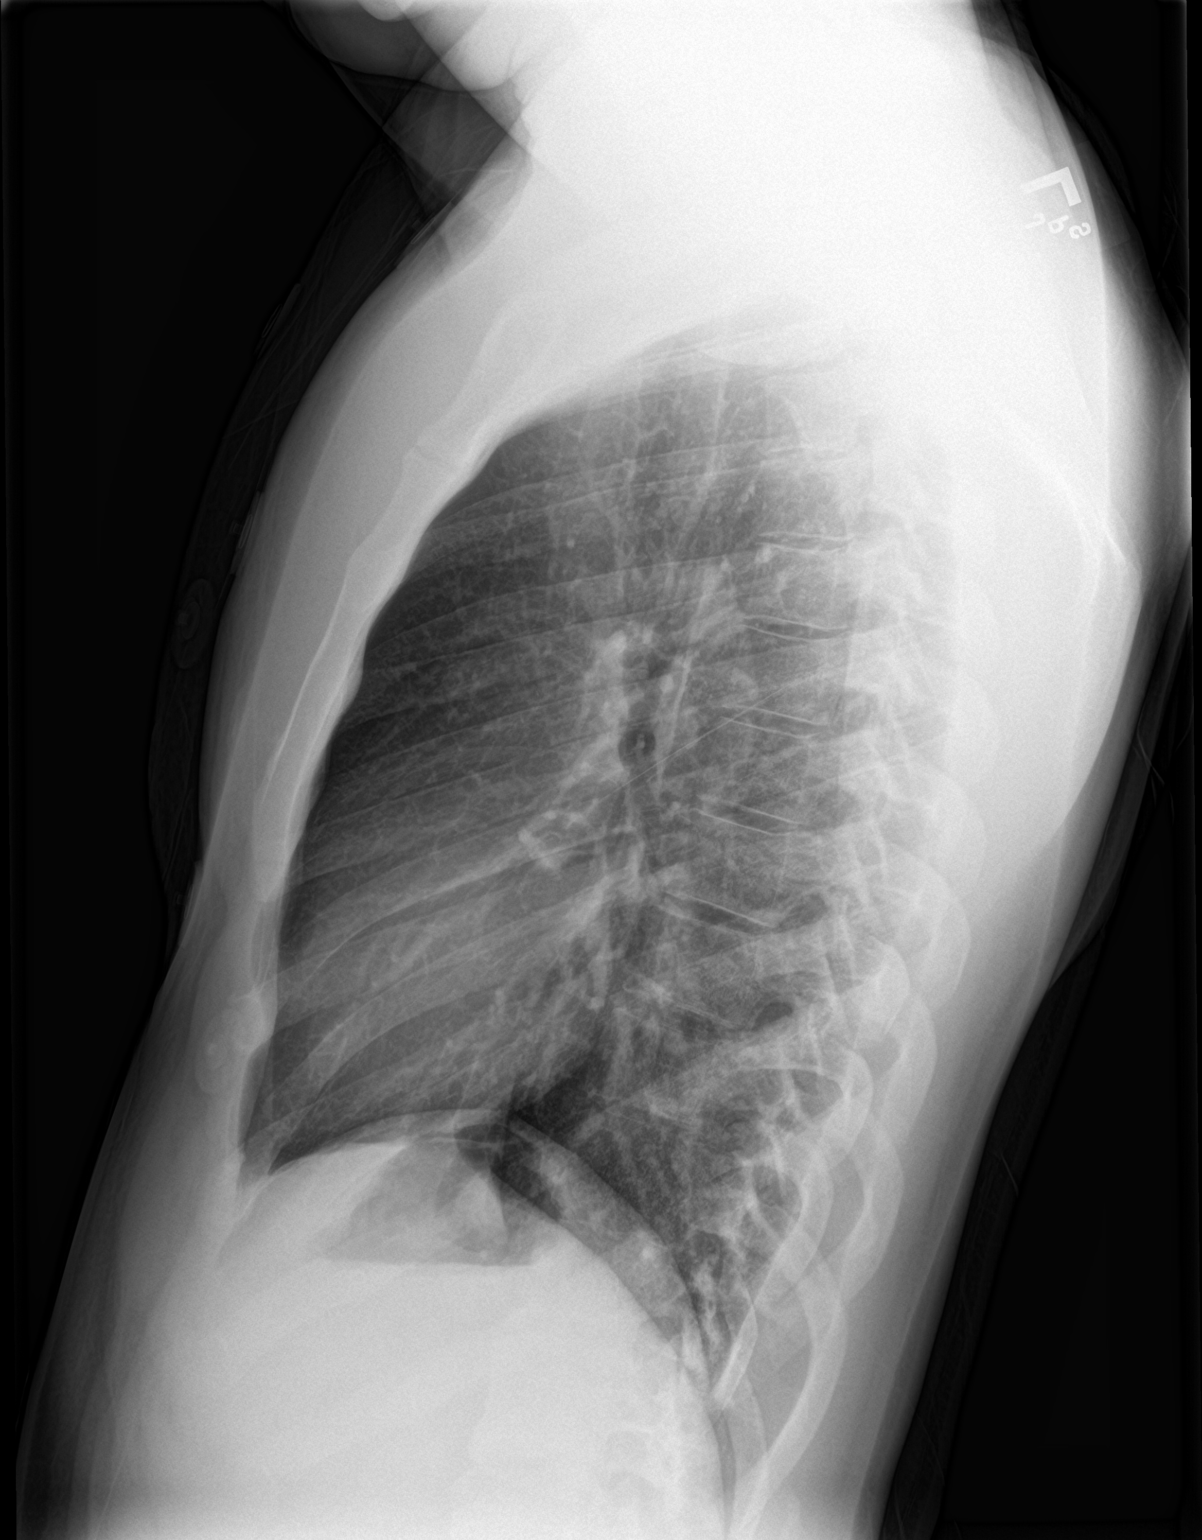

[2 of 2 positions shown; findings below may reference images not displayed]

FINDINGS: The heart size and mediastinal contours are within normal limits.
Both lungs are clear. No pneumothorax or pleural effusion is noted.
The visualized skeletal structures are unremarkable.
IMPRESSION: No active cardiopulmonary disease.

## 2021-05-17 IMAGING — DX DG CHEST 2V
2 series · 2 of 2 positions shown · non-contrast
Comparison: 09/13/2020.

CLINICAL DATA: Cough.  Shortness of breath.

EXAM:
CHEST - 2 VIEW

[chest pa]
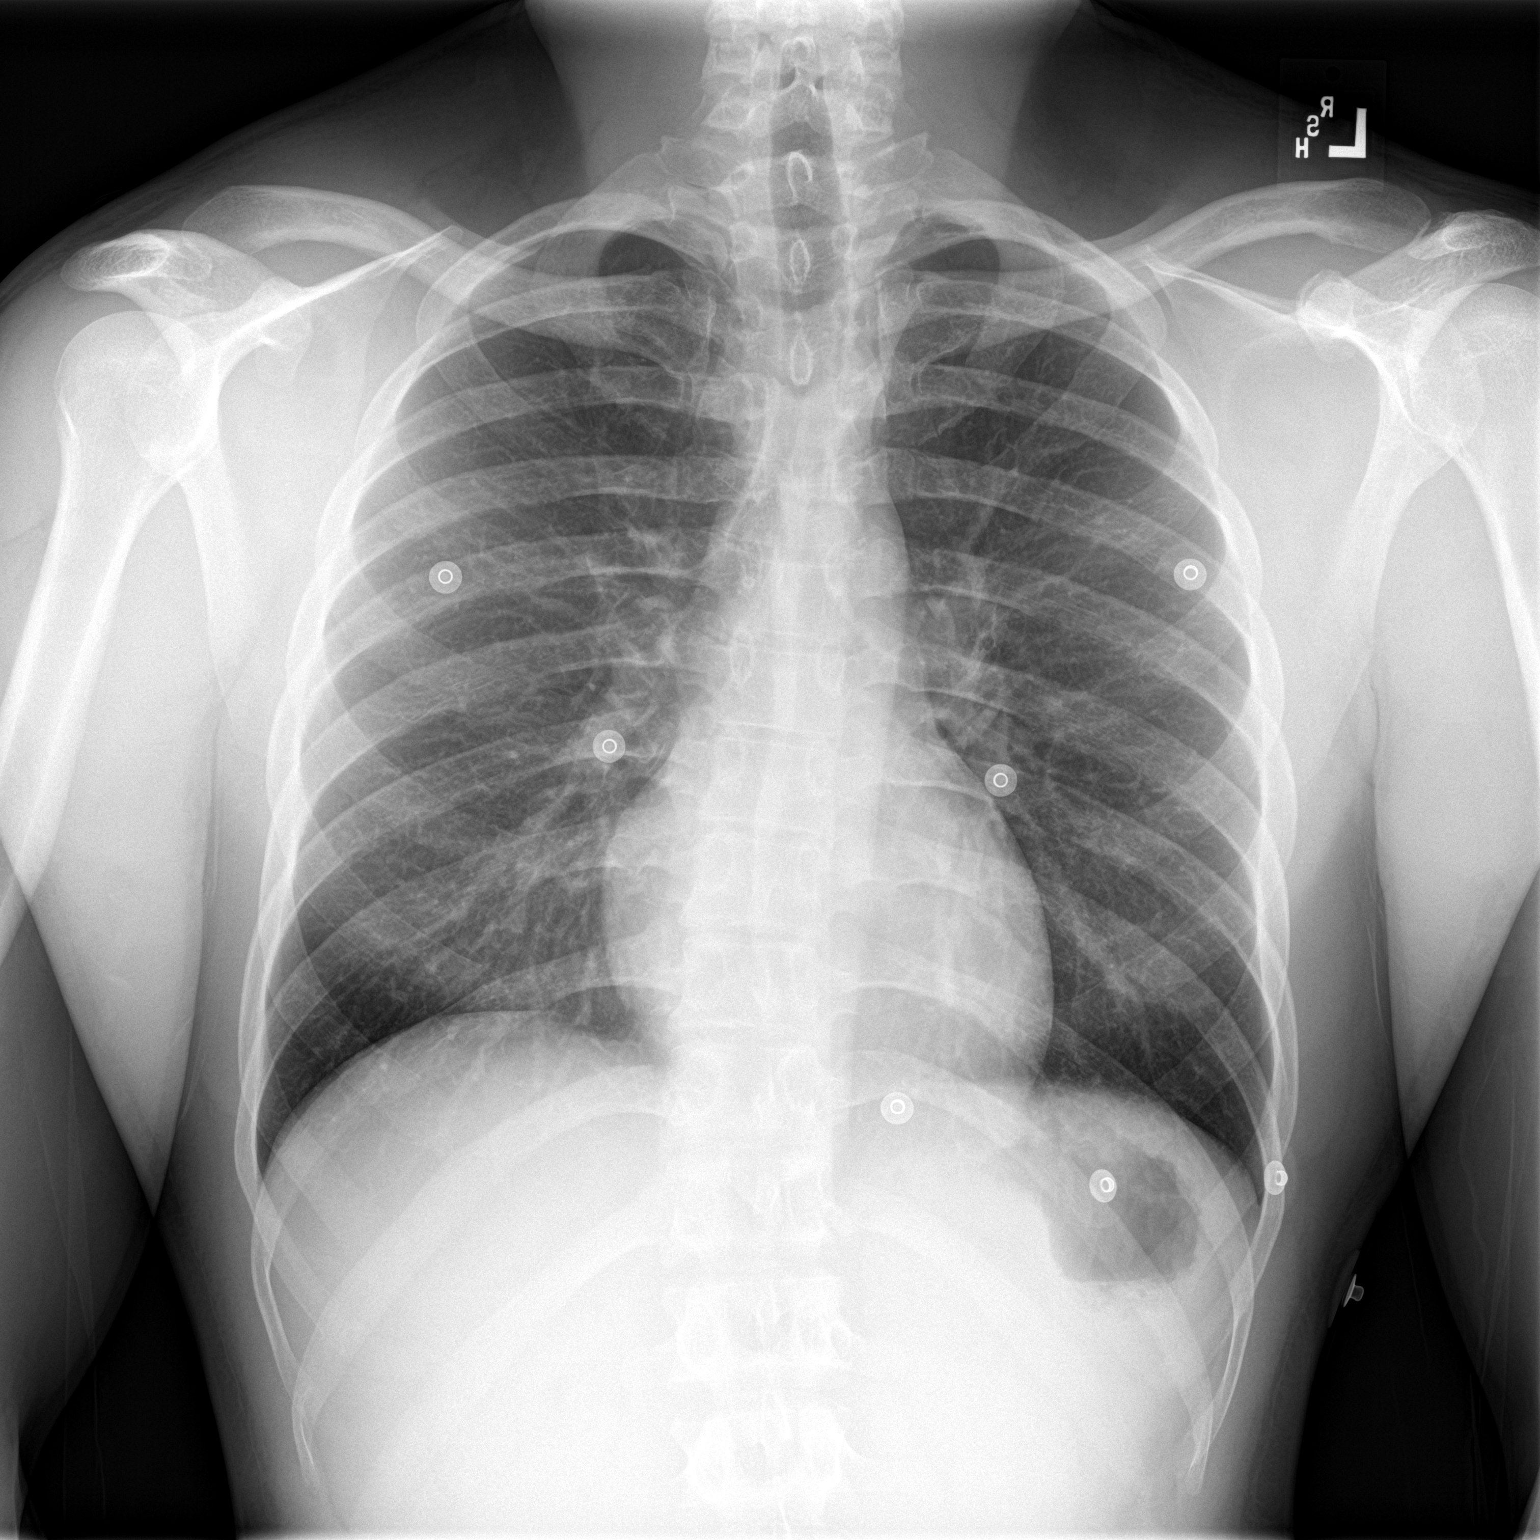

[chest lat]
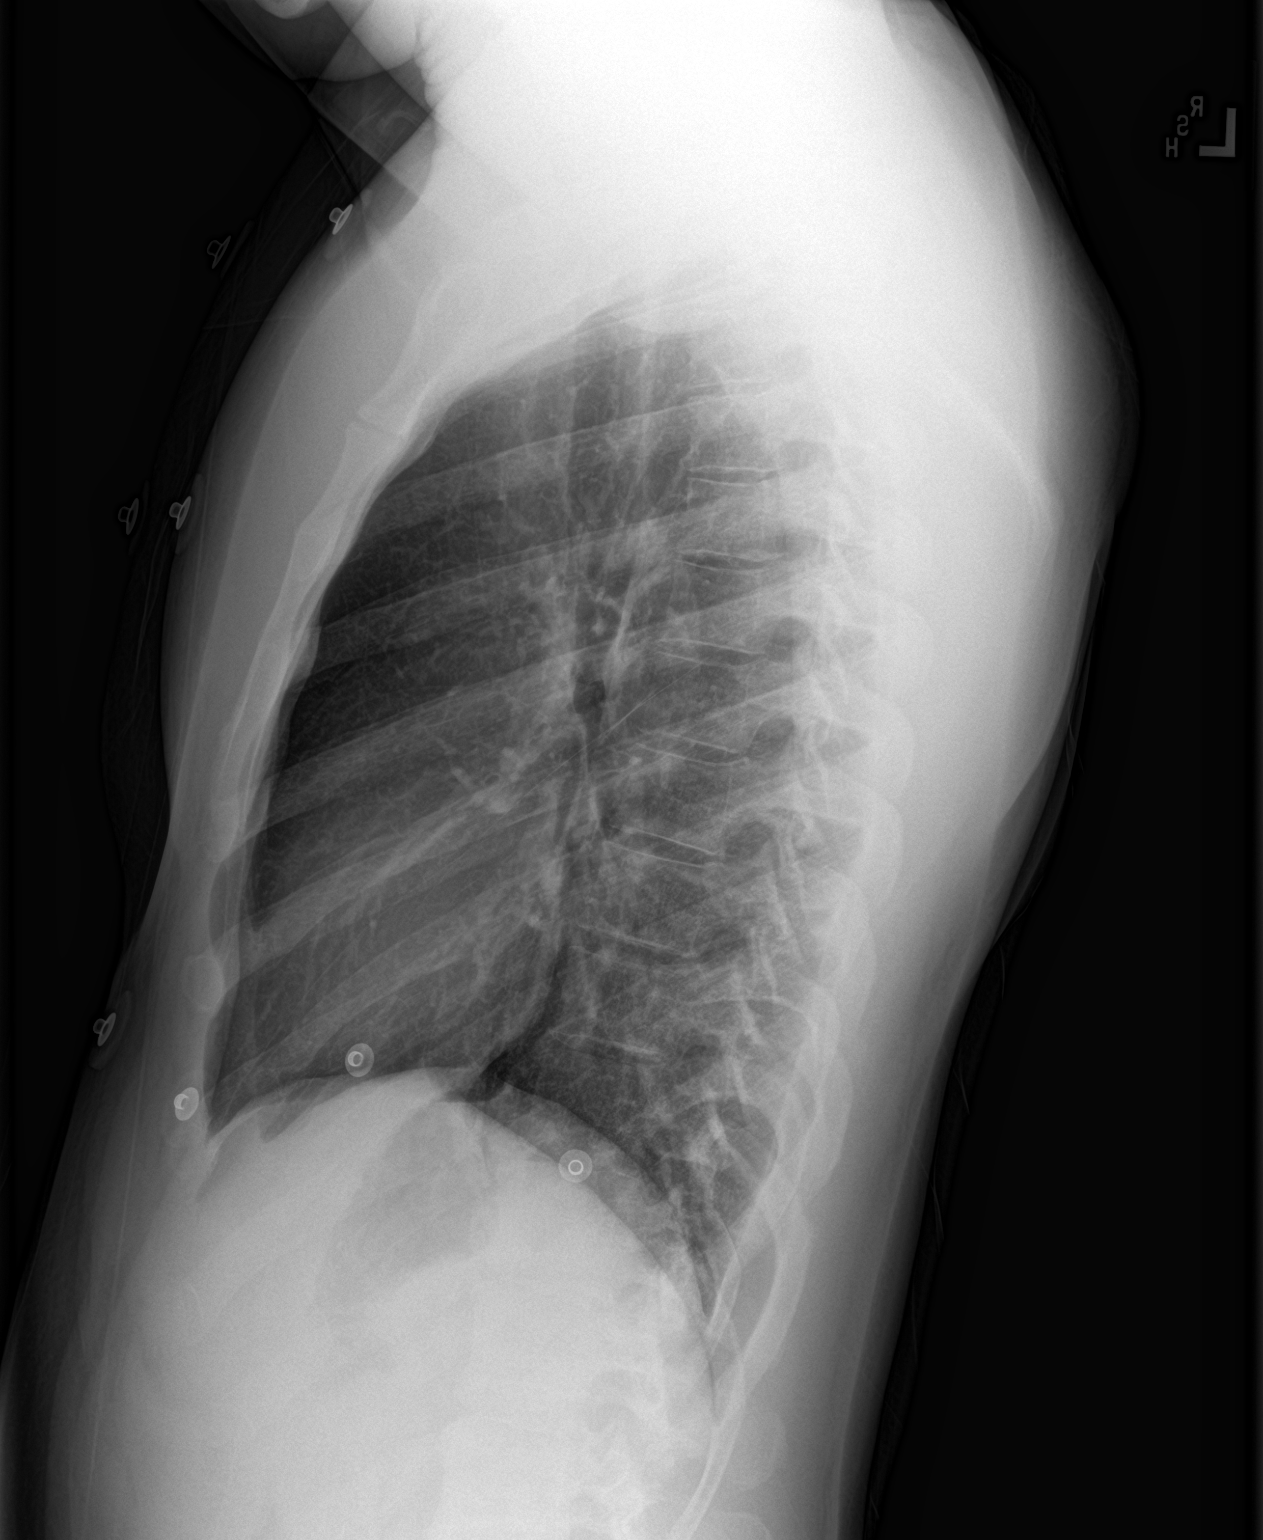

[2 of 2 positions shown; findings below may reference images not displayed]

FINDINGS: Mediastinum and hilar structures normal. No focal infiltrate. No
pleural effusion or pneumothorax. Heart size normal. Mild thoracic
spine scoliosis.
IMPRESSION: No acute cardiopulmonary disease.  Stable chest from 09/13/2020.

## 2021-05-21 ENCOUNTER — Other Ambulatory Visit: Payer: Self-pay

## 2021-05-21 ENCOUNTER — Encounter (HOSPITAL_BASED_OUTPATIENT_CLINIC_OR_DEPARTMENT_OTHER): Payer: Self-pay | Admitting: Emergency Medicine

## 2021-05-21 ENCOUNTER — Emergency Department (HOSPITAL_BASED_OUTPATIENT_CLINIC_OR_DEPARTMENT_OTHER)
Admission: EM | Admit: 2021-05-21 | Discharge: 2021-05-21 | Disposition: A | Discharge status: Home / Self Care | Payer: 59 | Attending: Emergency Medicine | Admitting: Emergency Medicine

## 2021-05-21 DIAGNOSIS — R0982 Postnasal drip: Secondary | ICD-10-CM | POA: Diagnosis not present

## 2021-05-21 DIAGNOSIS — Z20822 Contact with and (suspected) exposure to covid-19: Secondary | ICD-10-CM | POA: Insufficient documentation

## 2021-05-21 DIAGNOSIS — J029 Acute pharyngitis, unspecified: Secondary | ICD-10-CM | POA: Diagnosis not present

## 2021-05-21 LAB — RESP PANEL BY RT-PCR (FLU A&B, COVID) ARPGX2
Influenza A by PCR: NEGATIVE
Influenza B by PCR: NEGATIVE
SARS Coronavirus 2 by RT PCR: NEGATIVE

## 2021-05-21 NOTE — ED Provider Notes (Signed)
[place] EMERGENCY DEPARTMENT Provider Note  CSN: [id number] Arrival date & time: 05/21/21 0436  Chief Complaint(s) Sore Throat  HPI Shawn Howard is a 35 y.o. male    Influenza Presenting symptoms: cough, myalgias and sore throat   Severity:  Mild Onset quality:  Gradual Duration:  2 days Progression:  Unchanged Chronicity:  New Relieved by:  Nothing Worsened by:  Nothing Associated symptoms: nasal congestion   Associated symptoms: no chills   Risk factors: no diabetes problem, no heart disease, no immunocompromised state and no sick contacts    Past Medical History Past Medical History:  Diagnosis Date   Anxiety    Kidney stone    Osteoid osteoma of pelvis    Pyloric stenosis    Shingles    There are no problems to display for this patient.  Home Medication(s) Prior to Admission medications   Medication Sig Start Date End Date Taking? Authorizing Provider  benzonatate (TESSALON) 100 MG capsule Take 1 capsule (100 mg total) by mouth every 8 (eight) hours. 10/18/20   Carmin Muskrat, MD  polyethylene glycol (MIRALAX) 17 g packet Take 17 g by mouth daily. 09/11/19   Isla Pence, MD                                                                                                                                    Past Surgical History Past Surgical History:  Procedure Laterality Date   ABDOMINAL SURGERY     pyloric stenosis   Family History Family History  Problem Relation Age of Onset   Diabetes Mother    Hypertension Father     Social History Social History   Tobacco Use   Smoking status: Never   Smokeless tobacco: Never  Vaping Use   Vaping Use: Never used  Substance Use Topics   Alcohol use: No   Drug use: No   Allergies Patient has no known allergies.  Review of Systems Review of Systems  Constitutional:  Negative for chills.  HENT:  Positive for congestion and sore throat.   Respiratory:  Positive for cough.    Musculoskeletal:  Positive for myalgias.  All other systems are reviewed and are negative for acute change except as noted in the HPI  Physical Exam Vital Signs  I have reviewed the triage vital signs BP (!) 142/85 (BP Location: Right Arm)   Pulse 88   Temp 97.9 F (36.6 C) (Oral)   Resp 18   Ht 5\' 8"  (1.727 m)   Wt 76.2 kg   SpO2 100%   BMI 25.53 kg/m   Physical Exam Vitals reviewed.  Constitutional:      General: He is not in acute distress.    Appearance: He is well-developed. He is not diaphoretic.  HENT:     Head: Normocephalic and atraumatic.     Nose: Nose normal.     Mouth/Throat:     Palate:  No lesions.     Pharynx: No posterior oropharyngeal erythema.     Tonsils: No tonsillar exudate or tonsillar abscesses.     Comments: Post nasal drip Eyes:     General: No scleral icterus.       Right eye: No discharge.        Left eye: No discharge.     Conjunctiva/sclera: Conjunctivae normal.     Pupils: Pupils are equal, round, and reactive to light.  Cardiovascular:     Rate and Rhythm: Normal rate and regular rhythm.     Heart sounds: No murmur heard.   No friction rub. No gallop.  Pulmonary:     Effort: Pulmonary effort is normal. No respiratory distress.     Breath sounds: Normal breath sounds. No stridor. No rales.  Abdominal:     General: There is no distension.     Palpations: Abdomen is soft.     Tenderness: There is no abdominal tenderness.  Musculoskeletal:        General: No tenderness.     Cervical back: Normal range of motion and neck supple.  Skin:    General: Skin is warm and dry.     Findings: No erythema or rash.  Neurological:     Mental Status: He is alert and oriented to person, place, and time.    ED Results and Treatments Labs (all labs ordered are listed, but only abnormal results are displayed) Labs Reviewed  RESP PANEL BY RT-PCR (FLU A&B, COVID) [id number]                                                                                                                          EKG  EKG Interpretation  Date/Time:    Ventricular Rate:    PR Interval:    QRS Duration:   QT Interval:    QTC Calculation:   R Axis:     Text Interpretation:         Radiology No results found.  Pertinent labs & imaging results that were available during my care of the patient were reviewed by me and considered in my medical decision making (see MDM for details).  Medications Ordered in ED Medications - No data to display  Procedures Procedures  (including critical care time)  Medical Decision Making / ED Course I have reviewed the nursing notes for this encounter and the patient's prior records (if available in EHR or on provided paperwork).  Shawn Howard was evaluated in Emergency Department on 05/21/2021 for the symptoms described in the history of present illness. He was evaluated in the context of the global COVID-19 pandemic, which necessitated consideration that the patient might be at risk for infection with the SARS-CoV-2 virus that causes COVID-19. Institutional protocols and algorithms that pertain to the evaluation of patients at risk for COVID-19 are in a state of rapid change based on information released by regulatory bodies including the CDC and federal and state organizations. These policies and algorithms were followed during the patient's care in the ED.     35 y.o. male presents with cough, rhinorrhea for 2 days. Adequate oral hydration. Rest of history as above.  Patient appears well. No signs of toxicity. No hypoxia, tachypnea or other signs of respiratory distress. No sign of clinical dehydration. Lung exam clear. Rest of exam as above.  Most consistent with viral upper respiratory infection. COVID/Flu swab obtained. Patient to check MyChart for results in the morning.  No evidence  suggestive of pharyngitis, AOM, PNA,  Chest x-ray not indicated at this time.  Discussed symptomatic treatment with the patient and they will follow closely with their PCP.     Final Clinical Impression(s) / ED Diagnoses Final diagnoses:  Sore throat  Post-nasal drip   The patient appears reasonably screened and/or stabilized for discharge and I doubt any other medical condition or other [facility] requiring further screening, evaluation, or treatment in the ED at this time prior to discharge. Safe for discharge with strict return precautions.  Disposition: Discharge  Condition: Good  I have discussed the results, Dx and Tx plan with the patient/family who expressed understanding and agree(s) with the plan. Discharge instructions discussed at length. The patient/family was given strict return precautions who verbalized understanding of the instructions. No further questions at time of discharge.    ED Discharge Orders     None        Follow Up: Primary care provider  Call  if you do not have a primary care physician, contact [facility] at [phone] for referral     This chart was dictated using voice recognition software.  Despite best efforts to proofread,  errors can occur which can change the documentation meaning.    Fatima Blank, MD 05/21/21 [phone]

## 2021-05-21 NOTE — Discharge Instructions (Addendum)
You may take over-the-counter medicine for symptomatic relief, such as Tylenol, Motrin, TheraFlu, Alka seltzer , black elderberry, etc. Please limit acetaminophen (Tylenol) to 4000 mg and Ibuprofen (Motrin, Advil, etc.) to 2400 mg for a 24hr period. Please note that other over-the-counter medicine may contain acetaminophen or ibuprofen as a component of their ingredients.   

## 2021-05-21 NOTE — ED Triage Notes (Signed)
Reports sore throat, cough and body aches for a few days.

## 2021-06-13 ENCOUNTER — Other Ambulatory Visit: Payer: Self-pay

## 2021-06-13 ENCOUNTER — Emergency Department (HOSPITAL_BASED_OUTPATIENT_CLINIC_OR_DEPARTMENT_OTHER)
Admission: EM | Admit: 2021-06-13 | Discharge: 2021-06-13 | Disposition: A | Discharge status: Home / Self Care | Payer: 59 | Attending: Emergency Medicine | Admitting: Emergency Medicine

## 2021-06-13 ENCOUNTER — Encounter (HOSPITAL_BASED_OUTPATIENT_CLINIC_OR_DEPARTMENT_OTHER): Payer: Self-pay | Admitting: Emergency Medicine

## 2021-06-13 DIAGNOSIS — M25512 Pain in left shoulder: Secondary | ICD-10-CM

## 2021-06-13 MED ORDER — NAPROXEN 500 MG PO TABS
500.0000 mg | ORAL_TABLET | Freq: Two times a day (BID) | ORAL | 0 refills | Status: AC
Start: 2021-06-13 — End: ?

## 2021-06-13 NOTE — ED Provider Notes (Signed)
[place] EMERGENCY DEPARTMENT Provider Note   CSN: [id number] Arrival date & time: 06/13/21  0444     History Chief Complaint  Patient presents with   Shoulder Pain    Shawn Howard is a 35 y.o. male.  Patient is a 35 year old male with past medical history of anxiety.  Patient presenting today for evaluation of left shoulder pain.  This began 1 week ago in the absence of any specific injury or trauma.  He describes pain that starts in the back of the shoulder and radiates to the left arm.  It is worse with movement and palpation.  There are no alleviating factors.  He does report performing repetitive movements at work for [employer] and also works out regularly at [employer].  The history is provided by the patient.  Shoulder Pain Location:  Shoulder Pain details:    Quality:  Aching   Radiates to:  L arm   Severity:  Moderate   Onset quality:  Gradual   Duration:  1 week   Timing:  Constant   Progression:  Worsening     Past Medical History:  Diagnosis Date   Anxiety    Kidney stone    Osteoid osteoma of pelvis    Pyloric stenosis    Shingles     There are no problems to display for this patient.   Past Surgical History:  Procedure Laterality Date   ABDOMINAL SURGERY     pyloric stenosis       Family History  Problem Relation Age of Onset   Diabetes Mother    Hypertension Father     Social History   Tobacco Use   Smoking status: Never   Smokeless tobacco: Never  Vaping Use   Vaping Use: Never used  Substance Use Topics   Alcohol use: No   Drug use: No    Home Medications Prior to Admission medications   Medication Sig Start Date End Date Taking? Authorizing Provider  benzonatate (TESSALON) 100 MG capsule Take 1 capsule (100 mg total) by mouth every 8 (eight) hours. 10/18/20   Carmin Muskrat, MD  polyethylene glycol (MIRALAX) 17 g packet Take 17 g by mouth daily. 09/11/19   Isla Pence, MD    Allergies    Patient  has no known allergies.  Review of Systems   Review of Systems  All other systems reviewed and are negative.  Physical Exam Updated Vital Signs BP 138/88    Pulse 77    Temp 97.6 F (36.4 C) (Oral)    Resp 16    Ht 5\' 8"  (1.727 m)    Wt 77.1 kg    SpO2 100%    BMI 25.85 kg/m   Physical Exam Vitals and nursing note reviewed.  Constitutional:      General: He is not in acute distress.    Appearance: Normal appearance. He is not ill-appearing.  HENT:     Head: Normocephalic and atraumatic.  Pulmonary:     Effort: Pulmonary effort is normal.  Musculoskeletal:     Comments: The left shoulder and arm appears grossly normal and symmetrical with the right.  Ulnar and radial pulses are easily palpable and motor and sensation are intact throughout the entire hand.  There is mild tenderness to posterior aspect of the deltoid and scapula, but no visible or palpable abnormality.  Skin:    General: Skin is warm and dry.  Neurological:     Mental Status: He is alert.  ED Results / Procedures / Treatments   Labs (all labs ordered are listed, but only abnormal results are displayed) Labs Reviewed - No data to display  EKG None  Radiology No results found.  Procedures Procedures   Medications Ordered in ED Medications - No data to display  ED Course  I have reviewed the triage vital signs and the nursing notes.  Pertinent labs & imaging results that were available during my care of the patient were reviewed by me and considered in my medical decision making (see chart for details).    MDM Rules/Calculators/A&P  Patient presenting with what seems like musculoskeletal left shoulder pain.  I see no indication for x-rays as there was no injury or trauma.  Patient will be treated with anti-inflammatory medication, rest, and follow-up as needed if not improving.  Final Clinical Impression(s) / ED Diagnoses Final diagnoses:  None    Rx / DC Orders ED Discharge Orders     None         Veryl Speak, MD 06/13/21 [phone]

## 2021-06-13 NOTE — Discharge Instructions (Signed)
Begin taking naproxen as prescribed.  Rest.  Follow-up with primary doctor if not improving in the next 1 to 2 weeks, and return to the ER if symptoms significantly worsen or change.

## 2021-06-13 NOTE — ED Triage Notes (Signed)
Pt c/o left shoulder and arm pain x 1 week

## 2021-07-28 ENCOUNTER — Other Ambulatory Visit: Payer: Self-pay

## 2021-07-28 ENCOUNTER — Emergency Department (HOSPITAL_BASED_OUTPATIENT_CLINIC_OR_DEPARTMENT_OTHER)
Admission: EM | Admit: 2021-07-28 | Discharge: 2021-07-28 | Disposition: A | Discharge status: Home / Self Care | Payer: No Typology Code available for payment source | Attending: Emergency Medicine | Admitting: Emergency Medicine

## 2021-07-28 ENCOUNTER — Encounter (HOSPITAL_BASED_OUTPATIENT_CLINIC_OR_DEPARTMENT_OTHER): Payer: Self-pay | Admitting: *Deleted

## 2021-07-28 DIAGNOSIS — Y990 Civilian activity done for income or pay: Secondary | ICD-10-CM | POA: Diagnosis not present

## 2021-07-28 DIAGNOSIS — S0502XA Injury of conjunctiva and corneal abrasion without foreign body, left eye, initial encounter: Secondary | ICD-10-CM | POA: Diagnosis not present

## 2021-07-28 DIAGNOSIS — S0592XA Unspecified injury of left eye and orbit, initial encounter: Secondary | ICD-10-CM | POA: Diagnosis present

## 2021-07-28 DIAGNOSIS — X58XXXA Exposure to other specified factors, initial encounter: Secondary | ICD-10-CM | POA: Insufficient documentation

## 2021-07-28 MED ORDER — OFLOXACIN 0.3 % OP SOLN: 2.0000 [drp] | Freq: Four times a day (QID) | OPHTHALMIC | 0 refills | Status: AC

## 2021-07-28 MED ORDER — TETRACAINE HCL 0.5 % OP SOLN
2.0000 [drp] | Freq: Once | OPHTHALMIC | Status: AC
  Administered 2021-07-28: 2 [drp] via OPHTHALMIC
  Filled 2021-07-28: qty 4

## 2021-07-28 MED ORDER — FLUORESCEIN SODIUM 1 MG OP STRP
1.0000 | ORAL_STRIP | Freq: Once | OPHTHALMIC | Status: AC
  Administered 2021-07-28: 1 via OPHTHALMIC
  Filled 2021-07-28: qty 1

## 2021-07-28 NOTE — ED Triage Notes (Signed)
Pt reports he got debris in his eyes while working today

## 2021-07-28 NOTE — ED Provider Notes (Signed)
[place] EMERGENCY DEPARTMENT Provider Note   CSN: [id number] Arrival date & time: 07/28/21  1845     History  Chief Complaint  Patient presents with   Eye Problem    Shawn Howard is a 36 y.o. male.  With no pertinent past medical history presents to the emergency department with eye pain.  Patient states that he was at work today when he was hammering in the back of his truck.  He states that "some debris flew into both my eyes."  He states that since the event he has had some irritation to both his eyes and the feeling of foreign body in the right lower lid.  He states that he called his work Environmental consultant who said that he should have his eyes irrigated.  He does not wear contacts.  No visual changes.   Eye Problem Associated symptoms: no discharge, no itching, no photophobia and no redness       Home Medications Prior to Admission medications   Medication Sig Start Date End Date Taking? Authorizing Provider  benzonatate (TESSALON) 100 MG capsule Take 1 capsule (100 mg total) by mouth every 8 (eight) hours. 10/18/20   Carmin Muskrat, MD  naproxen (NAPROSYN) 500 MG tablet Take 1 tablet (500 mg total) by mouth 2 (two) times daily. 06/13/21   Veryl Speak, MD  polyethylene glycol (MIRALAX) 17 g packet Take 17 g by mouth daily. 09/11/19   Isla Pence, MD      Allergies    Patient has no known allergies.    Review of Systems   Review of Systems  Eyes:  Positive for pain. Negative for photophobia, discharge, redness, itching and visual disturbance.  All other systems reviewed and are negative.  Physical Exam Updated Vital Signs BP 128/86 (BP Location: Right Arm)    Pulse 75    Temp 98.6 F (37 C) (Oral)    Resp 18    Ht 5\' 6"  (1.676 m)    Wt 70.3 kg    SpO2 100%    BMI 25.02 kg/m  Physical Exam Vitals and nursing note reviewed.  Constitutional:      General: He is not in acute distress.    Appearance: Normal appearance. He is not ill-appearing or  toxic-appearing.  HENT:     Head: Normocephalic and atraumatic.     Nose: Nose normal.     Mouth/Throat:     Mouth: Mucous membranes are moist.  Eyes:     General: Lids are normal. Lids are everted, no foreign bodies appreciated. Vision grossly intact. Gaze aligned appropriately. No visual field deficit or scleral icterus.       Right eye: No foreign body or discharge.        Left eye: No foreign body or discharge.     Intraocular pressure: Right eye pressure is 36 mmHg. Left eye pressure is 38 mmHg. Measurements were taken using a handheld tonometer.    Extraocular Movements: Extraocular movements intact.     Right eye: Normal extraocular motion and no nystagmus.     Left eye: Normal extraocular motion.     Conjunctiva/sclera: Conjunctivae normal.     Pupils: Pupils are equal, round, and reactive to light.     Comments: Corneal abrasion noted to the left eye.  No obvious corneal abrasions or ulcers to the right eye.  No corneal ulcers to the left eye.  Injected are not injected.  No drainage.  Both eyes inspected for foreign body without evidence  of retained foreign body.  Pulmonary:     Effort: Pulmonary effort is normal. No respiratory distress.  Musculoskeletal:     Cervical back: Neck supple.  Skin:    General: Skin is warm and dry.     Capillary Refill: Capillary refill takes less than 2 seconds.     Findings: No rash.  Neurological:     General: No focal deficit present.     Mental Status: He is alert and oriented to person, place, and time. Mental status is at baseline.  Psychiatric:        Mood and Affect: Mood normal.        Behavior: Behavior normal.        Thought Content: Thought content normal.        Judgment: Judgment normal.    ED Results / Procedures / Treatments   Labs (all labs ordered are listed, but only abnormal results are displayed) Labs Reviewed - No data to display  EKG None  Radiology No results found.  Procedures Procedures   Medications  Ordered in ED Medications  tetracaine (PONTOCAINE) 0.5 % ophthalmic solution 2 drop (has no administration in time range)  fluorescein ophthalmic strip 1 strip (has no administration in time range)    ED Course/ Medical Decision Making/ A&P                           Medical Decision Making Risk Prescription drug management.  Patient presents to the ED with complaints of foreign body to eye. This involves an extensive number of treatment options, and is a complaint that carries with it a moderate risk of complications and morbidity.   Additional history obtained:  Additional history obtained from: N/A External records from outside source obtained and reviewed including: N/A  Medications  I ordered medication including tetracaine and fluorescein for eye exam Reevaluation of the patient after medication shows that patient  N/a  ED Course: 36 year old male who presents to the emergency department with debris to his eyes today at work. Vision is grossly intact.  He has no pain with extraocular movements or sensation of proptosis. Eye exam consistent with corneal abrasion seen on fluorescein staining.  Patient is otherwise well-appearing without evidence of retained foreign body, corneal ulcer, globe rupture, superimposed infection.  I prescribed him ofloxacin drops and instructed the patient to follow-up closely with ophthalmology, avoid wearing contacts.  Patient had 500 mL normal saline flush to both eyes  After consideration of the diagnostic results and the patients response to treatment, I feel that the patent would benefit from discharge. The patient has been appropriately medically screened and/or stabilized in the ED. I have low suspicion for any other emergent medical condition which would require further screening, evaluation or treatment in the ED or require inpatient management. The patient is overall well appearing and non-toxic in appearance. They are hemodynamically stable at  time of discharge.   Final Clinical Impression(s) / ED Diagnoses Final diagnoses:  Abrasion of left cornea, initial encounter    Rx / DC Orders ED Discharge Orders          Ordered    ofloxacin (OCUFLOX) 0.3 % ophthalmic solution  Every 6 hours        07/28/21 2033              Mickie Hillier, PA-C 07/28/21 2149    Long, Wonda Old[facility], MD 08/07/21 [phone]

## 2021-07-28 NOTE — Discharge Instructions (Addendum)
You were seen in the emergency department for getting debris in your eye. You do have corneal abrasion to your left eye. Please use eye drops I prescribed you, 2 drops, both eyes, every 6 hours for 5 days. Please return for any worsening symptoms.

## 2023-04-19 ENCOUNTER — Emergency Department (HOSPITAL_BASED_OUTPATIENT_CLINIC_OR_DEPARTMENT_OTHER): Payer: Self-pay

## 2023-04-19 ENCOUNTER — Emergency Department (HOSPITAL_BASED_OUTPATIENT_CLINIC_OR_DEPARTMENT_OTHER)
Admission: EM | Admit: 2023-04-19 | Discharge: 2023-04-19 | Disposition: A | Discharge status: Home / Self Care | Payer: Self-pay | Attending: Emergency Medicine | Admitting: Emergency Medicine

## 2023-04-19 ENCOUNTER — Encounter (HOSPITAL_BASED_OUTPATIENT_CLINIC_OR_DEPARTMENT_OTHER): Payer: Self-pay | Admitting: Emergency Medicine

## 2023-04-19 ENCOUNTER — Other Ambulatory Visit: Payer: Self-pay

## 2023-04-19 DIAGNOSIS — M546 Pain in thoracic spine: Secondary | ICD-10-CM

## 2023-04-19 LAB — CBC WITH DIFFERENTIAL/PLATELET
Abs Immature Granulocytes: 0 10*3/uL (ref 0.00–0.07)
Basophils Absolute: 0 10*3/uL (ref 0.0–0.1)
Basophils Relative: 1 %
Eosinophils Absolute: 0.2 10*3/uL (ref 0.0–0.5)
Eosinophils Relative: 4 %
HCT: 43.9 % (ref 39.0–52.0)
Hemoglobin: 14.6 g/dL (ref 13.0–17.0)
Immature Granulocytes: 0 %
Lymphocytes Relative: 37 %
Lymphs Abs: 2 10*3/uL (ref 0.7–4.0)
MCH: 30.3 pg (ref 26.0–34.0)
MCHC: 33.3 g/dL (ref 30.0–36.0)
MCV: 91.1 fL (ref 80.0–100.0)
Monocytes Absolute: 0.4 10*3/uL (ref 0.1–1.0)
Monocytes Relative: 8 %
Neutro Abs: 2.6 10*3/uL (ref 1.7–7.7)
Neutrophils Relative %: 50 %
Platelets: 253 10*3/uL (ref 150–400)
RBC: 4.82 MIL/uL (ref 4.22–5.81)
RDW: 13.3 % (ref 11.5–15.5)
WBC: 5.3 10*3/uL (ref 4.0–10.5)
nRBC: 0 % (ref 0.0–0.2)

## 2023-04-19 LAB — BASIC METABOLIC PANEL
Anion gap: 8 (ref 5–15)
BUN: 13 mg/dL (ref 6–20)
CO2: 24 mmol/L (ref 22–32)
Calcium: 8.9 mg/dL (ref 8.9–10.3)
Chloride: 102 mmol/L (ref 98–111)
Creatinine, Ser: 1 mg/dL (ref 0.61–1.24)
GFR, Estimated: >60 mL/min (ref >60)
Glucose, Bld: 90 mg/dL (ref 70–99)
Potassium: 4.3 mmol/L (ref 3.5–5.1)
Sodium: 134 mmol/L — ABNORMAL LOW (ref 135–145)

## 2023-04-19 LAB — TROPONIN I (HIGH SENSITIVITY): Troponin I (High Sensitivity): <2 ng/L (ref <18)

## 2023-04-19 LAB — D-DIMER, QUANTITATIVE: D-Dimer, Quant: 0.3 ug{FEU}/mL (ref 0.00–0.50)

## 2023-04-19 NOTE — ED Provider Notes (Signed)
Received patient in turnover from Dr. Bernette Mayers.  Please see their note for further details of Hx, PE.  Briefly patient is a 37 y.o. male with a Back Pain .  Likely musculoskeletal awaiting CXR.  Chest x-ray independently interpreted by me with blunting of the aortic knob but unchanged from last few x-rays.  Radiology read is negative.  Will discharge home.    Melene Plan, DO 04/19/23 [phone]

## 2023-04-19 NOTE — ED Provider Notes (Signed)
[place] EMERGENCY DEPARTMENT AT [facility]  Provider Note  CSN: [id number] Arrival date & time: 04/19/23 0536  History Chief Complaint  Patient presents with   Back Pain    Shawn Howard is a 37 y.o. 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 He woke up during the night feeling jittery, anxious with some SOB. He felt some unusual sensation in L lateral leg as well. His symptoms have since resolved, but he was nervous and decided to come get things checked out.    Home Medications Prior to Admission medications   Medication Sig Start Date End Date Taking? Authorizing Provider  benzonatate (TESSALON) 100 MG capsule Take 1 capsule (100 mg total) by mouth every 8 (eight) hours. 10/18/20   Gerhard Munch, MD  naproxen (NAPROSYN) 500 MG tablet Take 1 tablet (500 mg total) by mouth 2 (two) times daily. 06/13/21   Geoffery Lyons, MD  polyethylene glycol (MIRALAX) 17 g packet Take 17 g by mouth daily. 09/11/19   Jacalyn Lefevre, MD     Allergies    Patient has no known allergies.   Review of Systems   Review of Systems Please see HPI for pertinent positives and negatives  Physical Exam BP 133/87   Pulse 77   Temp 98 F (36.7 C) (Oral)   Resp 18   Ht 5\' 7"  (1.702 m)   Wt 69.4 kg   SpO2 99%   BMI 23.96 kg/m   Physical Exam Vitals and nursing note reviewed.  Constitutional:      Appearance: Normal appearance.  HENT:     Head: Normocephalic and atraumatic.     Nose: Nose normal.     Mouth/Throat:     Mouth: Mucous membranes are moist.  Eyes:     Extraocular Movements: Extraocular movements intact.     Conjunctiva/sclera: Conjunctivae normal.  Cardiovascular:     Rate and Rhythm: Normal rate.  Pulmonary:     Effort: Pulmonary effort is normal.     Breath sounds: Normal breath sounds.  Abdominal:     General: Abdomen is flat.     Palpations:  Abdomen is soft.     Tenderness: There is no abdominal tenderness.  Musculoskeletal:        General: No swelling or tenderness. Normal range of motion.     Cervical back: Neck supple.     Right lower leg: No edema.     Left lower leg: No edema.  Skin:    General: Skin is warm and dry.  Neurological:     General: No focal deficit present.     Mental Status: He is alert.  Psychiatric:        Mood and Affect: Mood normal.     ED Results / Procedures / Treatments   EKG EKG Interpretation Date/Time:  Saturday April 19 2023 06:12:54 EDT Ventricular Rate:  75 PR Interval:  144 QRS Duration:  89 QT Interval:  366 QTC Calculation: 409 R Axis:   84  Text Interpretation: Sinus rhythm Consider left atrial enlargement ST elev, probable normal early repol pattern No significant change since last tracing Confirmed by Susy Frizzle [phone]) on 04/19/2023 6:20:53 AM  Procedures Procedures  Medications Ordered in the ED Medications - No data to display  Initial Impression and Plan  Patient here with back pain likely MSK. Low likelihood  of PE, PTX, PNA or other acute process. Will check labs, CXR and EKG. HE is well appearing now. Exam and vitals are reassuring.   ED Course   Clinical Course as of 04/19/23 0704  Sat Apr 19, 2023  0645 CBC is normal.  [CS]  [phone] Dimer is negative.  [CS]  0653 BMP is normal.  [CS]  [id number] Care of the patient signed out at shift change.  [CS]    Clinical Course User Index [CS] Pollyann Savoy, MD     MDM Rules/Calculators/A&P Medical Decision Making Problems Addressed: Acute right-sided thoracic back pain: acute illness or injury  Amount and/or Complexity of Data Reviewed Labs: ordered. Decision-making details documented in ED Course. Radiology: ordered.     Final Clinical Impression(s) / ED Diagnoses Final diagnoses:  Acute right-sided thoracic back pain    Rx / DC Orders ED Discharge Orders     None        Pollyann Savoy, MD 04/19/23 [phone]

## 2023-04-19 NOTE — ED Notes (Signed)
Pt alert and oriented X 4 at the time of discharge. RR even and unlabored. No acute distress noted. Pt verbalized understanding of discharge instructions as discussed. Pt ambulatory to lobby at time of discharge.

## 2023-04-19 NOTE — Discharge Instructions (Signed)
Your tests today did not indicate you were having a heart attack.  We are going to treat this like you pulled a muscle.  Return for pain worse with exercise difficulty breathing.  Please follow-up with your family doctor in the office. Take 4 over the counter ibuprofen tablets 3 times a day or 2 over-the-counter naproxen tablets twice a day for pain. Also take tylenol 1000mg (2 extra strength) four times a day.

## 2023-04-19 NOTE — ED Triage Notes (Addendum)
Presents with c/o upper back pain in between shoulder blades x 1 month. Pt states that he is a [employer]. Also c/o left leg pain that started this morning.

## 2023-10-09 ENCOUNTER — Encounter (HOSPITAL_BASED_OUTPATIENT_CLINIC_OR_DEPARTMENT_OTHER): Payer: Self-pay | Admitting: Emergency Medicine

## 2023-10-09 ENCOUNTER — Emergency Department (HOSPITAL_BASED_OUTPATIENT_CLINIC_OR_DEPARTMENT_OTHER): Payer: Self-pay

## 2023-10-09 ENCOUNTER — Other Ambulatory Visit: Payer: Self-pay

## 2023-10-09 ENCOUNTER — Emergency Department (HOSPITAL_BASED_OUTPATIENT_CLINIC_OR_DEPARTMENT_OTHER)
Admission: EM | Admit: 2023-10-09 | Discharge: 2023-10-09 | Disposition: A | Discharge status: Home / Self Care | Payer: Self-pay | Attending: Emergency Medicine | Admitting: Emergency Medicine

## 2023-10-09 DIAGNOSIS — M7918 Myalgia, other site: Secondary | ICD-10-CM

## 2023-10-09 DIAGNOSIS — M546 Pain in thoracic spine: Secondary | ICD-10-CM | POA: Insufficient documentation

## 2023-10-09 DIAGNOSIS — R55 Syncope and collapse: Secondary | ICD-10-CM | POA: Insufficient documentation

## 2023-10-09 DIAGNOSIS — M791 Myalgia, unspecified site: Secondary | ICD-10-CM | POA: Insufficient documentation

## 2023-10-09 DIAGNOSIS — M542 Cervicalgia: Secondary | ICD-10-CM | POA: Insufficient documentation

## 2023-10-09 DIAGNOSIS — S0990XA Unspecified injury of head, initial encounter: Secondary | ICD-10-CM | POA: Insufficient documentation

## 2023-10-09 DIAGNOSIS — W01198A Fall on same level from slipping, tripping and stumbling with subsequent striking against other object, initial encounter: Secondary | ICD-10-CM | POA: Insufficient documentation

## 2023-10-09 DIAGNOSIS — R42 Dizziness and giddiness: Secondary | ICD-10-CM | POA: Insufficient documentation

## 2023-10-09 LAB — CBC WITH DIFFERENTIAL/PLATELET
Abs Immature Granulocytes: 0.01 10*3/uL (ref 0.00–0.07)
Basophils Absolute: 0.1 10*3/uL (ref 0.0–0.1)
Basophils Relative: 1 %
Eosinophils Absolute: 0.2 10*3/uL (ref 0.0–0.5)
Eosinophils Relative: 4 %
HCT: 45 % (ref 39.0–52.0)
Hemoglobin: 15.1 g/dL (ref 13.0–17.0)
Immature Granulocytes: 0 %
Lymphocytes Relative: 35 %
Lymphs Abs: 2 10*3/uL (ref 0.7–4.0)
MCH: 30.1 pg (ref 26.0–34.0)
MCHC: 33.6 g/dL (ref 30.0–36.0)
MCV: 89.6 fL (ref 80.0–100.0)
Monocytes Absolute: 0.5 10*3/uL (ref 0.1–1.0)
Monocytes Relative: 9 %
Neutro Abs: 3 10*3/uL (ref 1.7–7.7)
Neutrophils Relative %: 51 %
Platelets: 264 10*3/uL (ref 150–400)
RBC: 5.02 MIL/uL (ref 4.22–5.81)
RDW: 13 % (ref 11.5–15.5)
WBC: 5.8 10*3/uL (ref 4.0–10.5)
nRBC: 0 % (ref 0.0–0.2)

## 2023-10-09 LAB — URINALYSIS, ROUTINE W REFLEX MICROSCOPIC
Bacteria, UA: NONE SEEN
Bilirubin Urine: NEGATIVE
Glucose, UA: NEGATIVE mg/dL
Hgb urine dipstick: NEGATIVE
Ketones, ur: NEGATIVE mg/dL
Leukocytes,Ua: NEGATIVE
Nitrite: NEGATIVE
Protein, ur: NEGATIVE mg/dL
Specific Gravity, Urine: 1.012 (ref 1.005–1.030)
pH: 6.5 (ref 5.0–8.0)

## 2023-10-09 LAB — BASIC METABOLIC PANEL WITH GFR
Anion gap: 8 (ref 5–15)
BUN: 8 mg/dL (ref 6–20)
CO2: 28 mmol/L (ref 22–32)
Calcium: 10.2 mg/dL (ref 8.9–10.3)
Chloride: 101 mmol/L (ref 98–111)
Creatinine, Ser: 0.99 mg/dL (ref 0.61–1.24)
GFR, Estimated: >60 mL/min (ref >60)
Glucose, Bld: 90 mg/dL (ref 70–99)
Potassium: 4 mmol/L (ref 3.5–5.1)
Sodium: 137 mmol/L (ref 135–145)

## 2023-10-09 LAB — TROPONIN I (HIGH SENSITIVITY): Troponin I (High Sensitivity): <2 ng/L (ref <18)

## 2023-10-09 MED ORDER — MECLIZINE HCL 25 MG PO TABS
25.0000 mg | ORAL_TABLET | Freq: Once | ORAL | Status: AC
  Administered 2023-10-09: 25 mg via ORAL
  Filled 2023-10-09: qty 1

## 2023-10-09 MED ORDER — MECLIZINE HCL 25 MG PO TABS: 25.0000 mg | ORAL_TABLET | Freq: Three times a day (TID) | ORAL | 0 refills | Status: AC | PRN

## 2023-10-09 MED ORDER — LIDOCAINE 5 % EX PTCH
2.0000 | MEDICATED_PATCH | CUTANEOUS | 0 refills | Status: AC
Start: 2023-10-09 — End: ?

## 2023-10-09 MED ORDER — LIDOCAINE 5 % EX PTCH
2.0000 | MEDICATED_PATCH | CUTANEOUS | Status: DC
  Administered 2023-10-09: 2 via TRANSDERMAL
  Filled 2023-10-09: qty 2

## 2023-10-09 NOTE — ED Notes (Signed)
 Discharge paperwork given and verbally understood.

## 2023-10-09 NOTE — ED Provider Notes (Signed)
 [place] [facility] AT [facility] Provider Note   CSN: [id number] Arrival date & time: 10/09/23  1320     History  No chief complaint on file.   Shawn Howard is a 38 y.o. male past medical history significant for anxiety presents today after a syncopal episode on Monday.  Patient states that he blacked out and fell and hit the back of his head but was aware of noises the entire time.  Patient reports posterior head pain, neck pain, and T-spine pain.  Patient also reports intermittent dizziness when changing positions.  Patient denies numbness, weakness, loss of bowel or bladder, saddle anesthesia, nausea, vomiting, or confusion.  Patient does note mild tinnitus during dizziness episodes.  HPI     Home Medications Prior to Admission medications   Medication Sig Start Date End Date Taking? Authorizing Provider  lidocaine  (LIDODERM ) 5 % Place 2 patches onto the skin daily. Remove & Discard patch within 12 hours or as directed by MD 10/09/23  Yes Carol Loftin N, PA-C  meclizine  (ANTIVERT ) 25 MG tablet Take 1 tablet (25 mg total) by mouth 3 (three) times daily as needed for dizziness. 10/09/23  Yes Hesston Hitchens N, PA-C  benzonatate  (TESSALON ) 100 MG capsule Take 1 capsule (100 mg total) by mouth every 8 (eight) hours. 10/18/20   Dorenda Gandy, MD  naproxen  (NAPROSYN ) 500 MG tablet Take 1 tablet (500 mg total) by mouth 2 (two) times daily. 06/13/21   Orvilla Blander, MD  polyethylene glycol (MIRALAX ) 17 g packet Take 17 g by mouth daily. 09/11/19   Haviland, Julie, MD      Allergies    Patient has no known allergies.    Review of Systems   Review of Systems  HENT:  Positive for tinnitus.   Musculoskeletal:  Positive for back pain and neck pain.  Neurological:  Positive for dizziness and headaches.    Physical Exam Updated Vital Signs BP 123/85 (BP Location: Left Arm)   Pulse 78   Temp 98.1 F (36.7 C) (Oral)   Resp 17   Wt 70.3 kg   SpO2 100%   BMI 24.28  kg/m  Physical Exam Vitals and nursing note reviewed.  Constitutional:      General: He is not in acute distress.    Appearance: Normal appearance. He is well-developed. He is not ill-appearing, toxic-appearing or diaphoretic.  HENT:     Head: Normocephalic and atraumatic. No raccoon eyes or Battle's sign.      Comments: Patient does have minor abrasion to posterior occiput.  Tenderness to palpation of the same area.  Hemostatic on exam.    Right Ear: External ear normal.     Left Ear: External ear normal.     Nose: Nose normal.     Mouth/Throat:     Mouth: Mucous membranes are moist.  Eyes:     Extraocular Movements: Extraocular movements intact.     Conjunctiva/sclera: Conjunctivae normal.     Pupils: Pupils are equal, round, and reactive to light.  Cardiovascular:     Rate and Rhythm: Normal rate and regular rhythm.     Pulses: Normal pulses.     Heart sounds: Normal heart sounds. No murmur heard. Pulmonary:     Effort: Pulmonary effort is normal. No respiratory distress.     Breath sounds: Normal breath sounds.  Abdominal:     Palpations: Abdomen is soft.     Tenderness: There is no abdominal tenderness.  Musculoskeletal:  General: No swelling.     Cervical back: Normal range of motion and neck supple.     Comments: Mild tenderness to palpation of the paraspinal muscles in the thoracic region.  No bony tenderness of the spine, deformities, ecchymosis, or step-offs noticed.  Skin:    General: Skin is warm and dry.     Capillary Refill: Capillary refill takes less than 2 seconds.  Neurological:     General: No focal deficit present.     Mental Status: He is alert and oriented to person, place, and time.     Sensory: No sensory deficit.     Motor: No weakness.     Coordination: Coordination normal.     Gait: Gait normal.  Psychiatric:        Mood and Affect: Mood normal.     ED Results / Procedures / Treatments   Labs (all labs ordered are listed, but only  abnormal results are displayed) Labs Reviewed  BASIC METABOLIC PANEL WITH GFR  CBC WITH DIFFERENTIAL/PLATELET  URINALYSIS, ROUTINE W REFLEX MICROSCOPIC  TROPONIN I (HIGH SENSITIVITY)    EKG EKG Interpretation Date/Time:  Thursday October 09 2023 15:26:32 EDT Ventricular Rate:  75 PR Interval:  137 QRS Duration:  89 QT Interval:  377 QTC Calculation: 421 R Axis:   89  Text Interpretation: Sinus rhythm ST elev, probable normal early repol pattern No significant change since last tracing Confirmed by Zackowski, Scott [phone]) on 10/09/2023 5:15:27 PM  Radiology CT Cervical Spine Wo Contrast Result Date: 10/09/2023 CLINICAL DATA:  Neck trauma, dangerous injury mechanism (Age 87-64y). Syncope, hit head. EXAM: CT CERVICAL SPINE WITHOUT CONTRAST TECHNIQUE: Multidetector CT imaging of the cervical spine was performed without intravenous contrast. Multiplanar CT image reconstructions were also generated. RADIATION DOSE REDUCTION: This exam was performed according to the departmental dose-optimization program which includes automated exposure control, adjustment of the mA and/or kV according to patient size and/or use of iterative reconstruction technique. COMPARISON:  None Available. FINDINGS: Alignment: Normal Skull base and vertebrae: No acute fracture. No primary bone lesion or focal pathologic process. Soft tissues and spinal canal: No prevertebral fluid or swelling. No visible canal hematoma. Disc levels:  Normal Upper chest: Negative Other: None IMPRESSION: No acute bony abnormality. Electronically Signed   By: Janeece Mechanic M.D.   On: 10/09/2023 18:06   CT Head Wo Contrast Result Date: 10/09/2023 CLINICAL DATA:  Syncope.  Head trauma. EXAM: CT HEAD WITHOUT CONTRAST TECHNIQUE: Contiguous axial images were obtained from the base of the skull through the vertex without intravenous contrast. RADIATION DOSE REDUCTION: This exam was performed according to the departmental dose-optimization program which  includes automated exposure control, adjustment of the mA and/or kV according to patient size and/or use of iterative reconstruction technique. COMPARISON:  None Available. FINDINGS: Brain: No acute intracranial abnormality. Specifically, no hemorrhage, hydrocephalus, mass lesion, acute infarction, or significant intracranial injury. Vascular: No hyperdense vessel or unexpected calcification. Skull: No acute calvarial abnormality. Sinuses/Orbits: No acute findings Other: None IMPRESSION: Normal study. Electronically Signed   By: Janeece Mechanic M.D.   On: 10/09/2023 18:05    Procedures Procedures    Medications Ordered in ED Medications  lidocaine  (LIDODERM ) 5 % 2 patch (has no administration in time range)  meclizine  (ANTIVERT ) tablet 25 mg (25 mg Oral Given 10/09/23 1557)    ED Course/ Medical Decision Making/ A&P  Medical Decision Making Amount and/or Complexity of Data Reviewed Labs: ordered.   This patient presents to the ED for concern of syncopal episode, this involves an extensive number of treatment options, and is a complaint that carries with it a high risk of complications and morbidity.  The differential diagnosis includes concussion, brain bleed, spinal cord injury, epidural abscess, cauda equina syndrome, musculoskeletal pain, arrhythmia, electrolyte abnormality, STEMI, NSTEMI, orthostatic hypotension   Co morbidities that complicate the patient evaluation  Anxiety   Additional history obtained:  External records from outside source obtained and reviewed including Care Everywhere   Lab Tests:  I Ordered, and personally interpreted labs.  The pertinent results include:  CBC, UA and BMP WNL, troponin <2,    Imaging Studies ordered:  I ordered imaging studies including CT head and C-spine Noncon I independently visualized and interpreted imaging which showed no acute bony abnormality, normal CT head Noncon study I agree with the  radiologist interpretation   Cardiac Monitoring: / EKG:  The patient was maintained on a cardiac monitor.  I personally viewed and interpreted the cardiac monitored which showed an underlying rhythm of: Sinus rhythm   Problem List / ED Course / Critical interventions / Medication management  I ordered medication including meclizine  for dizziness Reevaluation of the patient after these medicines showed that the patient improved I have reviewed the patients home medicines and have made adjustments as needed Patient orthostatic negative  Orthostatic Lying BP- Lying: 128/88 Pulse- Lying: 74 Orthostatic Sitting BP- Sitting: 116/87 Pulse- Sitting: 69 Orthostatic Standing at 3 minutes BP- Standing at 3 minutes: 123/85 Pulse- Standing at 3 minutes: 84    Test / Admission - Considered:  Considered for admission or further workup however patient's vital signs, physical exam, labs, and imaging were reassuring.  Patient's symptoms likely due to musculoskeletal pain and mild head injury.  Patient given meclizine  and Lidoderm  patches outpatient for dizziness and pain.  Patient also advised to take Tylenol /Motrin  as needed.  Patient given strict return precautions.  Patient to follow-up with primary care for symptoms persist for further evaluation and treatment.        Final Clinical Impression(s) / ED Diagnoses Final diagnoses:  Syncope, unspecified syncope type  Mild closed head injury, initial encounter  Musculoskeletal pain    Rx / DC Orders ED Discharge Orders          Ordered    meclizine  (ANTIVERT ) 25 MG tablet  3 times daily PRN        10/09/23 1815    lidocaine  (LIDODERM ) 5 %  Every 24 hours        10/09/23 1820              Carie Charity, PA-C 10/09/23 1821    Zackowski, Scott, MD 10/10/23 1521

## 2023-10-09 NOTE — ED Notes (Addendum)
 Shawn Howard

## 2023-10-09 NOTE — ED Notes (Addendum)
 Transport to ct

## 2023-10-09 NOTE — ED Triage Notes (Signed)
 On Monday ,pt had syncopal episode, did pass out briefly, was aware of surroundings. Hit his head on pavement, head,neck and back (spine ) hurting as he did not break his fall. Since then ,has been progressively more dizzy, room is spinning when he tries to get up , his balance seems unsteady at times. No headaches, no confusion .

## 2023-10-09 NOTE — Discharge Instructions (Addendum)
 Today you are seen for syncope, mild head injury, musculoskeletal pain.  Please pick up your medication and take as prescribed.  You may also take Tylenol/Motrin as needed.  Please return to the ED if you have a sudden change in mental status, double vision, uncontrollable vomiting, or worsening symptoms.  Please follow-up with your primary care if your symptoms persist for further evaluation and treatment.  Thank you for letting us  treat you today. After reviewing your labs and imaging, I feel you are safe to go home. Please follow up with your PCP in the next several days and provide them with your records from this visit. Return to the Emergency Room if pain becomes severe or symptoms worsen.
